# Patient Record
Sex: Female | Born: 1990 | Race: White | Hispanic: No | Marital: Married | Smoking: Never smoker
Health system: Southern US, Community
[De-identification: ages and names within clinical notes are randomized; demographics above are authoritative.]

## PROBLEM LIST (undated history)

## (undated) DIAGNOSIS — J45909 Unspecified asthma, uncomplicated: Secondary | ICD-10-CM

## (undated) DIAGNOSIS — O139 Gestational [pregnancy-induced] hypertension without significant proteinuria, unspecified trimester: Secondary | ICD-10-CM

## (undated) DIAGNOSIS — L309 Dermatitis, unspecified: Secondary | ICD-10-CM

## (undated) DIAGNOSIS — L509 Urticaria, unspecified: Secondary | ICD-10-CM

## (undated) DIAGNOSIS — E039 Hypothyroidism, unspecified: Secondary | ICD-10-CM

## (undated) DIAGNOSIS — I10 Essential (primary) hypertension: Secondary | ICD-10-CM

## (undated) HISTORY — DX: Hypothyroidism, unspecified: E03.9

## (undated) HISTORY — DX: Dermatitis, unspecified: L30.9

## (undated) HISTORY — PX: WISDOM TOOTH EXTRACTION: SHX21

## (undated) HISTORY — DX: Gestational (pregnancy-induced) hypertension without significant proteinuria, unspecified trimester: O13.9

## (undated) HISTORY — DX: Essential (primary) hypertension: I10

## (undated) HISTORY — PX: NO PAST SURGERIES: SHX2092

## (undated) HISTORY — DX: Urticaria, unspecified: L50.9

## (undated) HISTORY — DX: Unspecified asthma, uncomplicated: J45.909

---

## 2017-07-06 DIAGNOSIS — G43709 Chronic migraine without aura, not intractable, without status migrainosus: Secondary | ICD-10-CM | POA: Diagnosis not present

## 2017-08-04 ENCOUNTER — Telehealth: Payer: Self-pay | Admitting: Physician Assistant

## 2017-08-04 NOTE — Telephone Encounter (Signed)
Tried to call pt to reschedule her for appt with Carlis Abbott for tomorrow 08/05/17. Left VM asking her to call the office and let us know if she would like to try and reschedule with [facility]ark or if she would like to try another provider

## 2017-08-05 ENCOUNTER — Ambulatory Visit: Payer: Self-pay | Admitting: Physician Assistant

## 2017-08-08 ENCOUNTER — Other Ambulatory Visit: Payer: Self-pay

## 2017-08-08 ENCOUNTER — Encounter: Payer: Self-pay | Admitting: Physician Assistant

## 2017-08-08 ENCOUNTER — Ambulatory Visit (INDEPENDENT_AMBULATORY_CARE_PROVIDER_SITE_OTHER): Payer: 59 | Admitting: Physician Assistant

## 2017-08-08 VITALS — BP 145/94 | HR 86 | Temp 98.2°F | Resp 16 | Ht 66.0 in | Wt 236.0 lb

## 2017-08-08 DIAGNOSIS — L989 Disorder of the skin and subcutaneous tissue, unspecified: Secondary | ICD-10-CM

## 2017-08-08 DIAGNOSIS — E039 Hypothyroidism, unspecified: Secondary | ICD-10-CM

## 2017-08-08 DIAGNOSIS — I10 Essential (primary) hypertension: Secondary | ICD-10-CM

## 2017-08-08 MED ORDER — LEVOTHYROXINE SODIUM 75 MCG PO TABS: 75.0000 ug | ORAL_TABLET | Freq: Every day | ORAL | 1 refills | Status: DC

## 2017-08-08 MED ORDER — LISINOPRIL 20 MG PO TABS: 20.0000 mg | ORAL_TABLET | Freq: Every day | ORAL | 1 refills | Status: DC

## 2017-08-08 NOTE — Patient Instructions (Addendum)
It was great meeting you.   Please await contact for your dermatology appointment.    IF you received an x-ray today, you will receive an invoice from [facility]. Please contact [facility] at [phone] with questions or concerns regarding your invoice.   IF you received labwork today, you will receive an invoice from [place]. Please contact [facility] at [phone] with questions or concerns regarding your invoice.   Our billing staff will not be able to assist you with questions regarding bills from these companies.  You will be contacted with the lab results as soon as they are available. The fastest way to get your results is to activate your My Chart account. Instructions are located on the last page of this paperwork. If you have not heard from [place] regarding the results in 2 weeks, please contact this office.

## 2017-08-08 NOTE — Progress Notes (Signed)
PRIMARY CARE AT [facility] [address], [place] [address] [phone]  Date:  08/08/2017   Name:  Christy Rivera   DOB:  02-Jun-1991   MRN:  [id number]  PCP:  Patient, No Pcp Per    History of Present Illness:  Christy Rivera is a 27 y.o. female patient who presents to PCP with  Chief Complaint  Patient presents with  . Medication Refill    lisinopril and levothyroxine/ est care     She is not out of her synthroid or lisinopril.  She is not out of her meds.   Levothyroxine '75mg'$  daily.  She has a maternal aunt with hypothyroid She had dizziness and fatigue when this was diagnosed.  It has been managed well with the levothyroxine.  No side effects to the medicaiton.  This was found 8 years ago.   She had no imaging with this finding Husband relocating She is moving from [place], to [place].   She has been on lisinopril 4-5 years.  No side effects to this medication.     No chest pains, palpitiations, leg swelling, sob, consitpation or diarrhea, no change to skin or hair.  No change of your mood.   Diet: she watches salt intake, typically low carb.  Dinner meat and green veggies.   Exercise: long walks 2-3 times per week with 2 miles.  Takes about 40 minutes.    She is currently working as Government social research officer.  She works for [employer]   Menses is normal.  Regular No children.  No pregnancies.  Normal pap hx.   [facility].    There are no active problems to display for this patient.   Past Medical History:  Diagnosis Date  . Asthma     Social History   Tobacco Use  . Smoking status: Never Smoker  . Smokeless tobacco: Never Used  Substance Use Topics  . Alcohol use: Yes  . Drug use: No    No family history on file.  Not on File  Medication list has been reviewed and updated.  Current Outpatient Medications on File Prior to Visit  Medication Sig Dispense Refill  . levothyroxine (SYNTHROID, LEVOTHROID) 75 MCG tablet Take 75 mcg by mouth daily before  breakfast.    . lisinopril (PRINIVIL,ZESTRIL) 20 MG tablet Take 20 mg by mouth daily.     No current facility-administered medications on file prior to visit.     ROS ROS otherwise unremarkable unless listed above.  Physical Examination: BP (!) 145/94   Pulse 86   Temp 98.2 F (36.8 C) (Oral)   Resp 16   Ht '5\' 6"'$  (1.676 m)   Wt 236 lb (107 kg)   LMP 07/29/2017   SpO2 98%   BMI 38.09 kg/m  Ideal Body Weight: Weight in (lb) to have BMI = 25: 154.6  Physical Exam  Constitutional: She is oriented to person, place, and time. She appears well-developed and well-nourished. No distress.  HENT:  Head: Normocephalic and atraumatic.  Right Ear: External ear normal.  Left Ear: External ear normal.  Eyes: Conjunctivae and EOM are normal. Pupils are equal, round, and reactive to light.  Cardiovascular: Normal rate.  Pulmonary/Chest: Effort normal. No respiratory distress.  Neurological: She is alert and oriented to person, place, and time.  Skin: She is not diaphoretic.  Psychiatric: She has a normal mood and affect. Her behavior is normal.     Assessment and Plan: Christy Rivera is a 27 y.o. female who is here today  for cc of  Chief Complaint  Patient presents with  . Medication Refill    lisinopril and levothyroxine/ est care  bp stable Tsh pending  Skin lesion - Plan: Ambulatory referral to Dermatology  Hypothyroidism, unspecified type - Plan: TSH, CMP14+EGFR, levothyroxine (SYNTHROID, LEVOTHROID) 75 MCG tablet  Essential hypertension - Plan: lisinopril (PRINIVIL,ZESTRIL) 20 MG tablet  Ivar Drape, PA-C [facility] 2/26/20193:13 PM

## 2017-08-09 LAB — CMP14+EGFR
A/G RATIO: 1.7 (ref 1.2–2.2)
ALT: 30 IU/L (ref 0–32)
AST: 18 IU/L (ref 0–40)
Albumin: 4.9 g/dL (ref 3.5–5.5)
Alkaline Phosphatase: 57 IU/L (ref 39–117)
BILIRUBIN TOTAL: 0.3 mg/dL (ref 0.0–1.2)
BUN/Creatinine Ratio: 11 (ref 9–23)
BUN: 7 mg/dL (ref 6–20)
CALCIUM: 9.3 mg/dL (ref 8.7–10.2)
CO2: 21 mmol/L (ref 20–29)
Chloride: 101 mmol/L (ref 96–106)
Creatinine, Ser: 0.65 mg/dL (ref 0.57–1.00)
GFR, EST AFRICAN AMERICAN: 141 mL/min/{1.73_m2} (ref >59)
GFR, EST NON AFRICAN AMERICAN: 122 mL/min/{1.73_m2} (ref >59)
GLOBULIN, TOTAL: 2.9 g/dL (ref 1.5–4.5)
Glucose: 88 mg/dL (ref 65–99)
POTASSIUM: 3.9 mmol/L (ref 3.5–5.2)
SODIUM: 140 mmol/L (ref 134–144)
TOTAL PROTEIN: 7.8 g/dL (ref 6.0–8.5)

## 2017-08-09 LAB — TSH: TSH: 2.09 u[IU]/mL (ref 0.450–4.500)

## 2017-08-26 ENCOUNTER — Telehealth: Payer: Self-pay | Admitting: Physician Assistant

## 2017-08-26 NOTE — Telephone Encounter (Signed)
MyChart message sent to pt about English leaving the practice

## 2017-09-28 ENCOUNTER — Encounter: Payer: Self-pay | Admitting: Physician Assistant

## 2017-11-07 ENCOUNTER — Encounter: Payer: 59 | Admitting: Physician Assistant

## 2017-11-30 DIAGNOSIS — L910 Hypertrophic scar: Secondary | ICD-10-CM | POA: Diagnosis not present

## 2017-11-30 DIAGNOSIS — D225 Melanocytic nevi of trunk: Secondary | ICD-10-CM | POA: Diagnosis not present

## 2017-11-30 DIAGNOSIS — L814 Other melanin hyperpigmentation: Secondary | ICD-10-CM | POA: Diagnosis not present

## 2017-11-30 DIAGNOSIS — L905 Scar conditions and fibrosis of skin: Secondary | ICD-10-CM | POA: Diagnosis not present

## 2018-01-02 DIAGNOSIS — L905 Scar conditions and fibrosis of skin: Secondary | ICD-10-CM | POA: Diagnosis not present

## 2018-01-23 ENCOUNTER — Ambulatory Visit (INDEPENDENT_AMBULATORY_CARE_PROVIDER_SITE_OTHER): Payer: 59 | Admitting: Family Medicine

## 2018-01-23 ENCOUNTER — Encounter: Payer: Self-pay | Admitting: Family Medicine

## 2018-01-23 ENCOUNTER — Other Ambulatory Visit: Payer: Self-pay

## 2018-01-23 VITALS — BP 138/93 | HR 97 | Temp 99.0°F | Ht 66.0 in | Wt 231.8 lb

## 2018-01-23 DIAGNOSIS — Z01419 Encounter for gynecological examination (general) (routine) without abnormal findings: Secondary | ICD-10-CM

## 2018-01-23 DIAGNOSIS — Z30011 Encounter for initial prescription of contraceptive pills: Secondary | ICD-10-CM | POA: Diagnosis not present

## 2018-01-23 DIAGNOSIS — E039 Hypothyroidism, unspecified: Secondary | ICD-10-CM

## 2018-01-23 DIAGNOSIS — I10 Essential (primary) hypertension: Secondary | ICD-10-CM | POA: Diagnosis not present

## 2018-01-23 LAB — POCT URINE PREGNANCY: Preg Test, Ur: NEGATIVE

## 2018-01-23 MED ORDER — LISINOPRIL-HYDROCHLOROTHIAZIDE 20-25 MG PO TABS: 1.0000 | ORAL_TABLET | Freq: Every day | ORAL | 1 refills | Status: DC

## 2018-01-23 MED ORDER — LEVOTHYROXINE SODIUM 75 MCG PO TABS: 75.0000 ug | ORAL_TABLET | Freq: Every day | ORAL | 1 refills | Status: DC

## 2018-01-23 MED ORDER — LEVONORGESTREL-ETHINYL ESTRAD 0.1-20 MG-MCG PO TABS: 1.0000 | ORAL_TABLET | Freq: Every day | ORAL | 11 refills | Status: DC

## 2018-01-23 NOTE — Patient Instructions (Signed)
     IF you received an x-ray today, you will receive an invoice from [facility]. Please contact [facility] at [phone] with questions or concerns regarding your invoice.   IF you received labwork today, you will receive an invoice from [facility]. Please contact [facility] at [phone] with questions or concerns regarding your invoice.   Our billing staff will not be able to assist you with questions regarding bills from these companies.  You will be contacted with the lab results as soon as they are available. The fastest way to get your results is to activate your My Chart account. Instructions are located on the last page of this paperwork. If you have not heard from us regarding the results in 2 weeks, please contact this office.     

## 2018-01-23 NOTE — Progress Notes (Signed)
8/5/201911:29 AM  Christy Rivera 10-05-1990, 27 y.o. female [id number]  Chief Complaint  Patient presents with  . Annual Exam    GYN exam    HPI:   Patient is a 27 y.o. female with past medical history significant for hypothyroidism and HTN who presents today for CPE  Last CPE about 1.5 years Diagnosed with HTN at age 28, no workup other than basic labs No sx of sleep apnea other than snoring Her mother also dx with HTN in her 60s Last pap 3 years ago, normal, denies abnormal G0, LMP 01/01/2018. Normal regular menses Denies h/o STDs Would like to restart OCPs, done ok with them in the past States dc with hypothyroidism about 3 years ago, stable on current dose Takes all meds as prescribed, denies se   Fall Risk  01/23/2018 01/23/2018  Falls in the past year? No No     Depression screen [facility] 2/9 01/23/2018 01/23/2018  Decreased Interest 0 0  Down, Depressed, Hopeless 0 0  PHQ - 2 Score 0 0    No Known Allergies  Prior to Admission medications   Medication Sig Start Date End Date Taking? Authorizing Provider  levothyroxine (SYNTHROID, LEVOTHROID) 75 MCG tablet Take 1 tablet (75 mcg total) by mouth daily before breakfast. 08/08/17  Yes English, Stephanie D, PA  lisinopril (PRINIVIL,ZESTRIL) 20 MG tablet Take 1 tablet (20 mg total) by mouth daily. 08/08/17  Yes Joretta Bachelor, PA    Past Medical History:  Diagnosis Date  . Asthma     History reviewed. No pertinent surgical history.  Social History   Tobacco Use  . Smoking status: Never Smoker  . Smokeless tobacco: Never Used  Substance Use Topics  . Alcohol use: Yes    Family History  Problem Relation Age of Onset  . Heart disease Mother   . Hyperlipidemia Mother   . Hypertension Mother   . Healthy Brother   . Heart disease Paternal Uncle   . Hyperlipidemia Paternal Uncle   . Hypertension Paternal Uncle   . Cancer Maternal Grandfather   . Diabetes Paternal Grandmother     Review of Systems    Constitutional: Negative for chills and fever.  Eyes: Negative for blurred vision and double vision.  Respiratory: Negative for cough and shortness of breath.   Cardiovascular: Negative for chest pain, palpitations and leg swelling.  Gastrointestinal: Negative for abdominal pain, nausea and vomiting.  Genitourinary: Negative for dysuria and hematuria.  Musculoskeletal: Negative for joint pain and myalgias.  Neurological: Negative for dizziness and headaches.  Psychiatric/Behavioral: Negative for depression. The patient is not nervous/anxious and does not have insomnia.   All other systems reviewed and are negative.    OBJECTIVE:  Blood pressure (!) 138/93, pulse 97, temperature 99 F (37.2 C), temperature source Oral, height '5\' 6"'$  (1.676 m), weight 231 lb 12.8 oz (105.1 kg), last menstrual period 01/02/2018, SpO2 99 %. Body mass index is 37.41 kg/m.    Visual Acuity Screening   Right eye Left eye Both eyes  Without correction:     With correction: '20/20 20/20 20/20 '$    Physical Exam  Constitutional: She is oriented to person, place, and time. She appears well-developed and well-nourished.  HENT:  Head: Normocephalic and atraumatic.  Right Ear: Hearing, tympanic membrane, external ear and ear canal normal.  Left Ear: Hearing, tympanic membrane, external ear and ear canal normal.  Mouth/Throat: Oropharynx is clear and moist.  Eyes: Pupils are equal, round, and reactive to light. Conjunctivae and  EOM are normal.  Neck: Neck supple. No thyromegaly present.  Cardiovascular: Normal rate, regular rhythm, normal heart sounds and intact distal pulses. Exam reveals no gallop and no friction rub.  No murmur heard. Pulmonary/Chest: Effort normal and breath sounds normal. She has no wheezes. She has no rhonchi. She has no rales. Right breast exhibits no inverted nipple, no mass, no nipple discharge, no skin change and no tenderness. Left breast exhibits no inverted nipple, no mass, no nipple  discharge, no skin change and no tenderness. Breasts are symmetrical.  Abdominal: Soft. Bowel sounds are normal. She exhibits no distension. There is no hepatosplenomegaly. There is no tenderness. There is no guarding.  Genitourinary: There is no rash or lesion on the right labia. There is no rash or lesion on the left labia. Uterus is not enlarged, not fixed and not tender. Cervix exhibits no motion tenderness, no discharge and no friability. Right adnexum displays no mass and no tenderness. Left adnexum displays no mass and no tenderness. No erythema in the vagina. No vaginal discharge found.  Musculoskeletal: Normal range of motion. She exhibits no edema.  Lymphadenopathy:    She has no cervical adenopathy.    She has no axillary adenopathy.       Right: No supraclavicular adenopathy present.       Left: No supraclavicular adenopathy present.  Neurological: She is alert and oriented to person, place, and time. She has normal reflexes.  Skin: Skin is warm and dry.  Psychiatric: She has a normal mood and affect.  Nursing note and vitals reviewed.   Results for orders placed or performed in visit on 01/23/18 (from the past 24 hour(s))  POCT urine pregnancy     Status: Normal   Collection Time: 01/23/18 12:04 PM  Result Value Ref Range   Preg Test, Ur Negative Negative     ASSESSMENT and PLAN  1. Encounter for annual routine gynecological examination Routine HCM labs ordered. HCM reviewed/discussed. Anticipatory guidance regarding healthy weight, lifestyle and choices given.  - Pap IG w/ reflex to HPV when ASC-U  2. Hypothyroidism, unspecified type Checking labs today, medications will be adjusted as needed.  - TSH - Lipid panel - levothyroxine (SYNTHROID, LEVOTHROID) 75 MCG tablet; Take 1 tablet (75 mcg total) by mouth daily before breakfast.  3. Essential hypertension Above goal. Adding hctz '25mg'$  daily. Med r/se/b reviewed - CMP14+EGFR - Lipid panel  4. Encounter for initial  prescription of contraceptive pills Discussed options, patient had done ok with OCPs in the past. BP slightly above goal, we discussed CVD risk of OCPs and importance of BP control. Also discussed importance of BC in setting of ACE use. - POCT urine pregnancy   Other orders - lisinopril-hydrochlorothiazide (PRINZIDE,ZESTORETIC) 20-25 MG tablet; Take 1 tablet by mouth daily. - levonorgestrel-ethinyl estradiol (AVIANE,ALESSE,LESSINA) 0.1-20 MG-MCG tablet; Take 1 tablet by mouth daily.  Return in about 6 weeks (around 03/06/2018).    Rutherford Guys, MD Primary Care at [place], [place] [address] Ph.  [phone] Fax [phone]

## 2018-01-24 LAB — LIPID PANEL
Chol/HDL Ratio: 3.1 ratio (ref 0.0–4.4)
Cholesterol, Total: 220 mg/dL — ABNORMAL HIGH (ref 100–199)
HDL: 72 mg/dL (ref >39)
LDL Calculated: 127 mg/dL — ABNORMAL HIGH (ref 0–99)
Triglycerides: 107 mg/dL (ref 0–149)
VLDL Cholesterol Cal: 21 mg/dL (ref 5–40)

## 2018-01-24 LAB — CMP14+EGFR
ALT: 22 IU/L (ref 0–32)
AST: 13 IU/L (ref 0–40)
Albumin/Globulin Ratio: 1.9 (ref 1.2–2.2)
Albumin: 4.8 g/dL (ref 3.5–5.5)
Alkaline Phosphatase: 61 IU/L (ref 39–117)
BUN/Creatinine Ratio: 16 (ref 9–23)
BUN: 11 mg/dL (ref 6–20)
Bilirubin Total: 0.4 mg/dL (ref 0.0–1.2)
CO2: 18 mmol/L — ABNORMAL LOW (ref 20–29)
Calcium: 9.3 mg/dL (ref 8.7–10.2)
Chloride: 101 mmol/L (ref 96–106)
Creatinine, Ser: 0.7 mg/dL (ref 0.57–1.00)
GFR calc Af Amer: 137 mL/min/{1.73_m2} (ref >59)
GFR calc non Af Amer: 119 mL/min/{1.73_m2} (ref >59)
Globulin, Total: 2.5 g/dL (ref 1.5–4.5)
Glucose: 89 mg/dL (ref 65–99)
Potassium: 4.2 mmol/L (ref 3.5–5.2)
Sodium: 137 mmol/L (ref 134–144)
Total Protein: 7.3 g/dL (ref 6.0–8.5)

## 2018-01-24 LAB — TSH: TSH: 2.61 u[IU]/mL (ref 0.450–4.500)

## 2018-01-26 LAB — PAP IG W/ RFLX HPV ASCU: PAP Smear Comment: 0

## 2018-02-03 DIAGNOSIS — G43709 Chronic migraine without aura, not intractable, without status migrainosus: Secondary | ICD-10-CM | POA: Diagnosis not present

## 2018-03-10 ENCOUNTER — Encounter: Payer: Self-pay | Admitting: Family Medicine

## 2018-03-10 ENCOUNTER — Other Ambulatory Visit: Payer: Self-pay

## 2018-03-10 ENCOUNTER — Ambulatory Visit: Payer: 59 | Admitting: Family Medicine

## 2018-03-10 VITALS — BP 142/93 | HR 94 | Temp 98.3°F | Ht 66.0 in | Wt 235.2 lb

## 2018-03-10 DIAGNOSIS — Z23 Encounter for immunization: Secondary | ICD-10-CM | POA: Diagnosis not present

## 2018-03-10 DIAGNOSIS — I10 Essential (primary) hypertension: Secondary | ICD-10-CM | POA: Diagnosis not present

## 2018-03-10 MED ORDER — AMLODIPINE BESYLATE 5 MG PO TABS: 5.0000 mg | ORAL_TABLET | Freq: Every day | ORAL | 1 refills | Status: DC

## 2018-03-10 NOTE — Patient Instructions (Signed)
° ° ° °  If you have lab work done today you will be contacted with your lab results within the next 2 weeks.  If you have not heard from us then please contact us. The fastest way to get your results is to register for My Chart. ° ° °IF you received an x-ray today, you will receive an invoice from [facility]. Please contact [facility] at [phone] with questions or concerns regarding your invoice.  ° °IF you received labwork today, you will receive an invoice from [facility]. Please contact [facility] at [phone] with questions or concerns regarding your invoice.  ° °Our billing staff will not be able to assist you with questions regarding bills from these companies. ° °You will be contacted with the lab results as soon as they are available. The fastest way to get your results is to activate your My Chart account. Instructions are located on the last page of this paperwork. If you have not heard from us regarding the results in 2 weeks, please contact this office. °  ° ° ° °

## 2018-03-10 NOTE — Progress Notes (Signed)
9/20/20199:27 AM  Christy Rivera [birth year], 28 y.o. female [id number]  Chief Complaint  Patient presents with  . Hypertension    Follow up on bp. Needs refill on bp meds    HPI:   Patient is a 27 y.o. female with past medical history significant for HTN who presents today for followup   Taking new BP meds Added HCTZ 25mg  at last visit No changes to BP Does not check at home Denies any sx  Fall Risk  03/10/2018 01/23/2018 01/23/2018  Falls in the past year? No No No     Depression screen [facility] 2/9 03/10/2018 01/23/2018 01/23/2018  Decreased Interest 0 0 0  Down, Depressed, Hopeless 0 0 0  PHQ - 2 Score 0 0 0    No Known Allergies  Prior to Admission medications   Medication Sig Start Date End Date Taking? Authorizing Provider  levonorgestrel-ethinyl estradiol (AVIANE,ALESSE,LESSINA) 0.1-20 MG-MCG tablet Take 1 tablet by mouth daily. 01/23/18  Yes Rutherford Guys, MD  levothyroxine (SYNTHROID, LEVOTHROID) 75 MCG tablet Take 1 tablet (75 mcg total) by mouth daily before breakfast. 01/23/18  Yes Rutherford Guys, MD  lisinopril-hydrochlorothiazide (PRINZIDE,ZESTORETIC) 20-25 MG tablet Take 1 tablet by mouth daily. 01/23/18  Yes Rutherford Guys, MD    Past Medical History:  Diagnosis Date  . Asthma   . Essential hypertension, benign     History reviewed. No pertinent surgical history.  Social History   Tobacco Use  . Smoking status: Never Smoker  . Smokeless tobacco: Never Used  Substance Use Topics  . Alcohol use: Yes    Family History  Problem Relation Age of Onset  . Heart disease Mother   . Hyperlipidemia Mother   . Hypertension Mother   . Healthy Brother   . Heart disease Paternal Uncle   . Hyperlipidemia Paternal Uncle   . Hypertension Paternal Uncle   . Cancer Maternal Grandfather   . Diabetes Paternal Grandmother     ROS Per hpi  OBJECTIVE:  Blood pressure (!) 142/93, pulse 94, temperature 98.3 F (36.8 C), temperature source Oral, height 5\' 6"  (1.676  m), weight 235 lb 3.2 oz (106.7 kg), last menstrual period 03/03/2018, SpO2 96 %. Body mass index is 37.96 kg/m.   BP Readings from Last 3 Encounters:  03/10/18 (!) 142/93  01/23/18 (!) 138/93  08/08/17 (!) 145/94   Wt Readings from Last 3 Encounters:  03/10/18 235 lb 3.2 oz (106.7 kg)  01/23/18 231 lb 12.8 oz (105.1 kg)  08/08/17 236 lb (107 kg)   Physical Exam  Constitutional: She is oriented to person, place, and time. She appears well-developed and well-nourished.  HENT:  Head: Normocephalic and atraumatic.  Mouth/Throat: Mucous membranes are normal.  Eyes: Pupils are equal, round, and reactive to light. Conjunctivae and EOM are normal. No scleral icterus.  Neck: Neck supple.  Pulmonary/Chest: Effort normal.  Neurological: She is alert and oriented to person, place, and time.  Skin: Skin is warm and dry.  Psychiatric: She has a normal mood and affect.  Nursing note and vitals reviewed.   ASSESSMENT and PLAN 1. Hypertension, unspecified type Not responsive to treatment so far, present before OCPs, no OSA sx, CMP and TSH normal. R/o Renal artery stenosis. Adding amlodipine - US Renal Artery Stenosis; Future  2. Need for prophylactic vaccination and inoculation against influenza - Flu Vaccine QUAD 36+ mos IM  Other orders - amLODipine (NORVASC) 5 MG tablet; Take 1 tablet (5 mg total) by mouth daily.  Return in about 4 weeks (around 04/07/2018).    Rutherford Guys, MD Primary Care at [place], [place] [address] Ph.  [phone] Fax [phone]

## 2018-03-20 ENCOUNTER — Encounter: Payer: Self-pay | Admitting: Family Medicine

## 2018-04-05 ENCOUNTER — Ambulatory Visit
Admission: RE | Admit: 2018-04-05 | Discharge: 2018-04-05 | Disposition: A | Discharge status: Home / Self Care | Payer: 59 | Source: Ambulatory Visit | Attending: Family Medicine | Admitting: Family Medicine

## 2018-04-05 DIAGNOSIS — I10 Essential (primary) hypertension: Secondary | ICD-10-CM

## 2018-04-05 DIAGNOSIS — I150 Renovascular hypertension: Secondary | ICD-10-CM | POA: Diagnosis not present

## 2018-04-10 ENCOUNTER — Ambulatory Visit (INDEPENDENT_AMBULATORY_CARE_PROVIDER_SITE_OTHER): Payer: 59 | Admitting: Family Medicine

## 2018-04-10 ENCOUNTER — Encounter: Payer: Self-pay | Admitting: Family Medicine

## 2018-04-10 ENCOUNTER — Other Ambulatory Visit: Payer: Self-pay

## 2018-04-10 VITALS — BP 132/84 | HR 100 | Temp 98.6°F | Ht 66.0 in | Wt 238.0 lb

## 2018-04-10 DIAGNOSIS — I10 Essential (primary) hypertension: Secondary | ICD-10-CM

## 2018-04-10 DIAGNOSIS — E039 Hypothyroidism, unspecified: Secondary | ICD-10-CM

## 2018-04-10 MED ORDER — LEVOTHYROXINE SODIUM 75 MCG PO TABS: 75.0000 ug | ORAL_TABLET | Freq: Every day | ORAL | 1 refills | Status: DC

## 2018-04-10 MED ORDER — LEVONORGESTREL-ETHINYL ESTRAD 0.1-20 MG-MCG PO TABS: 1.0000 | ORAL_TABLET | Freq: Every day | ORAL | 11 refills | Status: DC

## 2018-04-10 MED ORDER — AMLODIPINE BESYLATE 5 MG PO TABS: 5.0000 mg | ORAL_TABLET | Freq: Every day | ORAL | 1 refills | Status: DC

## 2018-04-10 MED ORDER — LISINOPRIL-HYDROCHLOROTHIAZIDE 20-25 MG PO TABS: 1.0000 | ORAL_TABLET | Freq: Every day | ORAL | 1 refills | Status: DC

## 2018-04-10 NOTE — Progress Notes (Signed)
10/21/20198:37 AM  Christy Rivera [birth year], 27 y.o. female [id number]  Chief Complaint  Patient presents with  . Follow-up    follow up for [place] taken on last Wednesday of the kidney    HPI:   Patient is a 27 y.o. female with past medical history significant for HTN who presents today for followup after renal ultrasound  neg [place] for RAS but maybe fibromuscular dysplasia. if resistent then consider CTA abd w contrast  Fall Risk  04/10/2018 03/10/2018 01/23/2018 01/23/2018  Falls in the past year? No No No No     Depression screen [facility] 2/9 04/10/2018 03/10/2018 01/23/2018  Decreased Interest 0 0 0  Down, Depressed, Hopeless 0 0 0  PHQ - 2 Score 0 0 0    No Known Allergies  Prior to Admission medications   Medication Sig Start Date End Date Taking? Authorizing Provider  amLODipine (NORVASC) 5 MG tablet Take 1 tablet (5 mg total) by mouth daily. 03/10/18  Yes Rutherford Guys, MD  levonorgestrel-ethinyl estradiol (AVIANE,ALESSE,LESSINA) 0.1-20 MG-MCG tablet Take 1 tablet by mouth daily. 01/23/18  Yes Rutherford Guys, MD  levothyroxine (SYNTHROID, LEVOTHROID) 75 MCG tablet Take 1 tablet (75 mcg total) by mouth daily before breakfast. 01/23/18  Yes Rutherford Guys, MD  lisinopril-hydrochlorothiazide (PRINZIDE,ZESTORETIC) 20-25 MG tablet Take 1 tablet by mouth daily. 01/23/18  Yes Rutherford Guys, MD    Past Medical History:  Diagnosis Date  . Asthma   . Essential hypertension, benign     History reviewed. No pertinent surgical history.  Social History   Tobacco Use  . Smoking status: Never Smoker  . Smokeless tobacco: Never Used  Substance Use Topics  . Alcohol use: Yes    Family History  Problem Relation Age of Onset  . Heart disease Mother   . Hyperlipidemia Mother   . Hypertension Mother   . Healthy Brother   . Heart disease Paternal Uncle   . Hyperlipidemia Paternal Uncle   . Hypertension Paternal Uncle   . Cancer Maternal Grandfather   . Diabetes Paternal  Grandmother     ROS Per hpi  OBJECTIVE:  Blood pressure 138/81, pulse 100, temperature 98.6 F (37 C), temperature source Oral, height 5\' 6"  (1.676 m), weight 238 lb (108 kg), SpO2 97 %. Body mass index is 38.41 kg/m.   BP Readings from Last 3 Encounters:  04/10/18 138/81  03/10/18 (!) 142/93  01/23/18 (!) 138/93    Physical Exam  Constitutional: She is oriented to person, place, and time. She appears well-developed and well-nourished.  HENT:  Head: Normocephalic and atraumatic.  Mouth/Throat: Mucous membranes are normal.  Eyes: Pupils are equal, round, and reactive to light. Conjunctivae and EOM are normal. No scleral icterus.  Neck: Neck supple.  Pulmonary/Chest: Effort normal.  Neurological: She is alert and oriented to person, place, and time.  Skin: Skin is warm and dry.  Psychiatric: She has a normal mood and affect.  Nursing note and vitals reviewed.  Lab Results  Component Value Date   TSH 2.610 01/23/2018   Lab Results  Component Value Date   CREATININE 0.70 01/23/2018   Lab Results  Component Value Date   NA 137 01/23/2018   K 4.2 01/23/2018   CL 101 01/23/2018   CO2 18 (L) 01/23/2018    ASSESSMENT and PLAN  1. Hypertension, unspecified type At goal on current regime. Renal US not normal, however not sure if clinically significant. Referring to renal to further assess.  - Ambulatory referral  to Nephrology  2. Hypothyroidism, unspecified type Controlled. Continue current regime.  - levothyroxine (SYNTHROID, LEVOTHROID) 75 MCG tablet; Take 1 tablet (75 mcg total) by mouth daily before breakfast.  Other orders - amLODipine (NORVASC) 5 MG tablet; Take 1 tablet (5 mg total) by mouth daily. - lisinopril-hydrochlorothiazide (PRINZIDE,ZESTORETIC) 20-25 MG tablet; Take 1 tablet by mouth daily. - levonorgestrel-ethinyl estradiol (AVIANE,ALESSE,LESSINA) 0.1-20 MG-MCG tablet; Take 1 tablet by mouth daily.  Return in about 4 months (around 08/11/2018) for  after renal, BP.    Rutherford Guys, MD Primary Care at [place], [place] [address] Ph.  [phone] Fax [phone]

## 2018-04-10 NOTE — Patient Instructions (Signed)
° ° ° °  If you have lab work done today you will be contacted with your lab results within the next 2 weeks.  If you have not heard from us then please contact us. The fastest way to get your results is to register for My Chart. ° ° °IF you received an x-ray today, you will receive an invoice from [facility]. Please contact [facility] at [phone] with questions or concerns regarding your invoice.  ° °IF you received labwork today, you will receive an invoice from [facility]. Please contact [facility] at [phone] with questions or concerns regarding your invoice.  ° °Our billing staff will not be able to assist you with questions regarding bills from these companies. ° °You will be contacted with the lab results as soon as they are available. The fastest way to get your results is to activate your My Chart account. Instructions are located on the last page of this paperwork. If you have not heard from us regarding the results in 2 weeks, please contact this office. °  ° ° ° °

## 2018-04-18 ENCOUNTER — Encounter: Payer: Self-pay | Admitting: Family Medicine

## 2018-04-18 ENCOUNTER — Ambulatory Visit: Payer: 59 | Admitting: Family Medicine

## 2018-04-18 VITALS — BP 132/84 | HR 89 | Temp 98.8°F | Resp 17 | Ht 66.0 in | Wt 240.0 lb

## 2018-04-18 DIAGNOSIS — L732 Hidradenitis suppurativa: Secondary | ICD-10-CM | POA: Diagnosis not present

## 2018-04-18 MED ORDER — DOXYCYCLINE HYCLATE 100 MG PO TABS: 100.0000 mg | ORAL_TABLET | Freq: Two times a day (BID) | ORAL | 0 refills | Status: DC

## 2018-04-18 NOTE — Progress Notes (Signed)
Subjective:  By signing my name below, I, [name], attest that this documentation has been prepared under the direction and in the presence of Merri Ray, MD. Electronically Signed: [name], [place]. 04/18/2018 , 4:33 PM .  Patient was seen in Room 9 .   Patient ID: Christy Rivera, female    DOB: [birth year], 27 y.o.   MRN: [id number] Chief Complaint  Patient presents with  . knot in armpit   HPI Christy Rivera is a 27 y.o. female  Patient states she woke up 5 days ago and noticed a large bump in her left armpit with a small bump towards the middle of her armpit. She felt the bumps and described hard feeling. She mentions the area started as a pinpoint pimple, and became painful on Sunday morning (2 days ago). She applied hot compresses over the area with some relief. The bumps never came to head, though the areas have become smaller. She denies any drainage from the area. She denies any change in razors or deodorant.   She works as a Marine scientist in [employer]. She moved from [place], [place].   There are no active problems to display for this patient.  Past Medical History:  Diagnosis Date  . Asthma   . Essential hypertension, benign    No past surgical history on file. No Known Allergies Prior to Admission medications   Medication Sig Start Date End Date Taking? Authorizing Provider  amLODipine (NORVASC) 5 MG tablet Take 1 tablet (5 mg total) by mouth daily. 04/10/18   Rutherford Guys, MD  levonorgestrel-ethinyl estradiol (AVIANE,ALESSE,LESSINA) 0.1-20 MG-MCG tablet Take 1 tablet by mouth daily. 04/10/18   Rutherford Guys, MD  levothyroxine (SYNTHROID, LEVOTHROID) 75 MCG tablet Take 1 tablet (75 mcg total) by mouth daily before breakfast. 04/10/18   Rutherford Guys, MD  lisinopril-hydrochlorothiazide (PRINZIDE,ZESTORETIC) 20-25 MG tablet Take 1 tablet by mouth daily. 04/10/18   Rutherford Guys, MD   Social History   Socioeconomic History  . Marital status:  Married    Spouse name: Not on file  . Number of children: Not on file  . Years of education: Not on file  . Highest education level: Not on file  Occupational History  . Not on file  Social Needs  . Financial resource strain: Not on file  . Food insecurity:    Worry: Not on file    Inability: Not on file  . Transportation needs:    Medical: Not on file    Non-medical: Not on file  Tobacco Use  . Smoking status: Never Smoker  . Smokeless tobacco: Never Used  Substance and Sexual Activity  . Alcohol use: Yes  . Drug use: No  . Sexual activity: Yes  Lifestyle  . Physical activity:    Days per week: Not on file    Minutes per session: Not on file  . Stress: Not on file  Relationships  . Social connections:    Talks on phone: Not on file    Gets together: Not on file    Attends religious service: Not on file    Active member of club or organization: Not on file    Attends meetings of clubs or organizations: Not on file    Relationship status: Not on file  . Intimate partner violence:    Fear of current or ex partner: Not on file    Emotionally abused: Not on file    Physically abused: Not on file    Forced  sexual activity: Not on file  Other Topics Concern  . Not on file  Social History Narrative  . Not on file   Review of Systems  Constitutional: Negative for chills, fatigue, fever and unexpected weight change.  Respiratory: Negative for cough.   Gastrointestinal: Negative for constipation, diarrhea, nausea and vomiting.  Skin: Negative for rash and wound.       2 bumps/lesion - left axilla  Neurological: Negative for dizziness, weakness and headaches.       Objective:   Physical Exam  Constitutional: She is oriented to person, place, and time. She appears well-developed and well-nourished. No distress.  HENT:  Head: Normocephalic and atraumatic.  Eyes: Pupils are equal, round, and reactive to light. EOM are normal.  Neck: Neck supple.  Cardiovascular:  Normal rate.  Pulmonary/Chest: Effort normal. No respiratory distress.  Musculoskeletal: Normal range of motion.  Neurological: She is alert and oriented to person, place, and time.  Skin: Skin is warm and dry.  Left axilla: 2 separate areas within the left axilla, one at inferior medial aspect, erythematous indurated area approximately 0.5 x 1.5 cm without any central fluctuance; another lesion/area upper axilla towards the upper arm that is tender to palpation, also with some erythema and induration approximately 0.5 x 1.5 cm without any central fluctuance; no lymphadenopathy  Psychiatric: She has a normal mood and affect. Her behavior is normal.  Nursing note and vitals reviewed.   Vitals:   04/18/18 1559  BP: 132/84  Pulse: 89  Resp: 17  Temp: 98.8 F (37.1 C)  TempSrc: Oral  SpO2: 98%  Weight: 240 lb (108.9 kg)  Height: 5\' 6"  (1.676 m)       Assessment & Plan:    Christy Rivera is a 27 y.o. female Axillary hidradenitis suppurativa - Plan: doxycycline (VIBRA-TABS) 100 MG tablet  -And rhinitis versus folliculitis of left axilla, no drainable abscess appreciated at this time.  Continue symptomatic care with warm compresses, avoiding shaving, doxycycline prescribed if not continue to improve in the next day or 2.  RTC precautions discussed.  Meds ordered this encounter  Medications  . doxycycline (VIBRA-TABS) 100 MG tablet    Sig: Take 1 tablet (100 mg total) by mouth 2 (two) times daily.    Dispense:  14 tablet    Refill:  0   Patient Instructions   Continue warm compresses, but if not improving in next few days, can start doxycycline. Thanks for coming in today.   Folliculitis Folliculitis is inflammation of the hair follicles. Folliculitis most commonly occurs on the scalp, thighs, legs, back, and buttocks. However, it can occur anywhere on the body. What are the causes? This condition may be caused by:  A bacterial infection (common).  A fungal infection.  A  viral infection.  Coming into contact with certain chemicals, especially oils and tars.  Shaving or waxing.  Applying greasy ointments or creams to your skin often.  Long-lasting folliculitis and folliculitis that keeps coming back can be caused by bacteria that live in the nostrils. What increases the risk? This condition is more likely to develop in people with:  A weakened immune system.  Diabetes.  Obesity.  What are the signs or symptoms? Symptoms of this condition include:  Redness.  Soreness.  Swelling.  Itching.  Small white or yellow, pus-filled, itchy spots (pustules) that appear over a reddened area. If there is an infection that goes deep into the follicle, these may develop into a boil (furuncle).  A group  of closely packed boils (carbuncle). These tend to form in hairy, sweaty areas of the body.  How is this diagnosed? This condition is diagnosed with a skin exam. To find what is causing the condition, your health care provider may take a sample of one of the pustules or boils for testing. How is this treated? This condition may be treated by:  Applying warm compresses to the affected areas.  Taking an antibiotic medicine or applying an antibiotic medicine to the skin.  Applying or bathing with an antiseptic solution.  Taking an over-the-counter medicine to help with itching.  Having a procedure to drain any pustules or boils. This may be done if a pustule or boil contains a lot of pus or fluid.  Laser hair removal. This may be done to treat long-lasting folliculitis.  Follow these instructions at home:  If directed, apply heat to the affected area as often as told by your health care provider. Use the heat source that your health care provider recommends, such as a moist heat pack or a heating pad. ? Place a towel between your skin and the heat source. ? Leave the heat on for 20-30 minutes. ? Remove the heat if your skin turns bright red. This is  especially important if you are unable to feel pain, heat, or cold. You may have a greater risk of getting burned.  If you were prescribed an antibiotic medicine, use it as told by your health care provider. Do not stop using the antibiotic even if you start to feel better.  Take over-the-counter and prescription medicines only as told by your health care provider.  Do not shave irritated skin.  Keep all follow-up visits as told by your health care provider. This is important. Get help right away if:  You have more redness, swelling, or pain in the affected area.  Red streaks are spreading from the affected area.  You have a fever. This information is not intended to replace advice given to you by your health care provider. Make sure you discuss any questions you have with your health care provider. Document Released: 08/16/2001 Document Revised: 12/26/2015 Document Reviewed: 03/28/2015 Elsevier Interactive Patient Education  [employer].   If you have lab work done today you will be contacted with your lab results within the next 2 weeks.  If you have not heard from [place] then please contact us. The fastest way to get your results is to register for My Chart.   IF you received an x-ray today, you will receive an invoice from [facility]. Please contact [facility] at [phone] with questions or concerns regarding your invoice.   IF you received labwork today, you will receive an invoice from [place] [facility]. Please contact [facility] at [phone] with questions or concerns regarding your invoice.   Our billing staff will not be able to assist you with questions regarding bills from these companies.  You will be contacted with the lab results as soon as they are available. The fastest way to get your results is to activate your My Chart account. Instructions are located on the last page of this paperwork. If you have not heard from [place] regarding the results in 2 weeks,  please contact this office.       I personally performed the services described in this documentation, which was scribed in my presence. The recorded information has been reviewed and considered for accuracy and completeness, addended by me as needed, and agree with information above.  Signed,   Merri Ray, MD [facility].  04/20/18 10:22 PM

## 2018-04-18 NOTE — Patient Instructions (Addendum)
Continue warm compresses, but if not improving in next few days, can start doxycycline. Thanks for coming in today.   Folliculitis Folliculitis is inflammation of the hair follicles. Folliculitis most commonly occurs on the scalp, thighs, legs, back, and buttocks. However, it can occur anywhere on the body. What are the causes? This condition may be caused by:  A bacterial infection (common).  A fungal infection.  A viral infection.  Coming into contact with certain chemicals, especially oils and tars.  Shaving or waxing.  Applying greasy ointments or creams to your skin often.  Long-lasting folliculitis and folliculitis that keeps coming back can be caused by bacteria that live in the nostrils. What increases the risk? This condition is more likely to develop in people with:  A weakened immune system.  Diabetes.  Obesity.  What are the signs or symptoms? Symptoms of this condition include:  Redness.  Soreness.  Swelling.  Itching.  Small white or yellow, pus-filled, itchy spots (pustules) that appear over a reddened area. If there is an infection that goes deep into the follicle, these may develop into a boil (furuncle).  A group of closely packed boils (carbuncle). These tend to form in hairy, sweaty areas of the body.  How is this diagnosed? This condition is diagnosed with a skin exam. To find what is causing the condition, your health care provider may take a sample of one of the pustules or boils for testing. How is this treated? This condition may be treated by:  Applying warm compresses to the affected areas.  Taking an antibiotic medicine or applying an antibiotic medicine to the skin.  Applying or bathing with an antiseptic solution.  Taking an over-the-counter medicine to help with itching.  Having a procedure to drain any pustules or boils. This may be done if a pustule or boil contains a lot of pus or fluid.  Laser hair removal. This may be  done to treat long-lasting folliculitis.  Follow these instructions at home:  If directed, apply heat to the affected area as often as told by your health care provider. Use the heat source that your health care provider recommends, such as a moist heat pack or a heating pad. ? Place a towel between your skin and the heat source. ? Leave the heat on for 20-30 minutes. ? Remove the heat if your skin turns bright red. This is especially important if you are unable to feel pain, heat, or cold. You may have a greater risk of getting burned.  If you were prescribed an antibiotic medicine, use it as told by your health care provider. Do not stop using the antibiotic even if you start to feel better.  Take over-the-counter and prescription medicines only as told by your health care provider.  Do not shave irritated skin.  Keep all follow-up visits as told by your health care provider. This is important. Get help right away if:  You have more redness, swelling, or pain in the affected area.  Red streaks are spreading from the affected area.  You have a fever. This information is not intended to replace advice given to you by your health care provider. Make sure you discuss any questions you have with your health care provider. Document Released: 08/16/2001 Document Revised: 12/26/2015 Document Reviewed: 03/28/2015 Elsevier Interactive Patient Education  [employer].   If you have lab work done today you will be contacted with your lab results within the next 2 weeks.  If you have not  heard from [place] then please contact us. The fastest way to get your results is to register for My Chart.   IF you received an x-ray today, you will receive an invoice from [facility]. Please contact [facility] at [phone] with questions or concerns regarding your invoice.   IF you received labwork today, you will receive an invoice from [place]. Please contact [facility] at  [phone] with questions or concerns regarding your invoice.   Our billing staff will not be able to assist you with questions regarding bills from these companies.  You will be contacted with the lab results as soon as they are available. The fastest way to get your results is to activate your My Chart account. Instructions are located on the last page of this paperwork. If you have not heard from [place] regarding the results in 2 weeks, please contact this office.

## 2018-05-15 ENCOUNTER — Telehealth: Payer: Self-pay

## 2018-05-15 ENCOUNTER — Encounter: Payer: Self-pay | Admitting: Family Medicine

## 2018-05-15 NOTE — Telephone Encounter (Signed)
Message sent to Referrals to check on referral to Nephrology - sent 04/10/2018 Pt wants [place] to follow up.  See message - she has called [facility] and they could not tell her anything..... Thanks

## 2018-05-15 NOTE — Telephone Encounter (Signed)
Spoke to patient and adv that I paper faxed over to [facility] 11/25

## 2018-05-30 DIAGNOSIS — I10 Essential (primary) hypertension: Secondary | ICD-10-CM | POA: Diagnosis not present

## 2018-06-01 ENCOUNTER — Other Ambulatory Visit: Payer: Self-pay | Admitting: Nephrology

## 2018-06-01 DIAGNOSIS — I10 Essential (primary) hypertension: Secondary | ICD-10-CM

## 2018-06-05 ENCOUNTER — Encounter: Payer: Self-pay | Admitting: Family Medicine

## 2018-06-05 NOTE — Progress Notes (Signed)
Diagnostic studies: Renal Artery Duplex ; no significant evidence of renal artery stenosis bilaterally by duplex ultrasound. There is some elevation of proximal and mild velocities in the right renal artery. Although not diagnostic of a hemodynamically significate renal artery stenosis, some asymmetry is present compared to the left side. Component of a process such as fibromuscular dysplasia cannot be entirely excluded. If there remains clinically significant hypertension, consider further evaluation with CTA of the abdomen with contrast.,

## 2018-06-06 ENCOUNTER — Ambulatory Visit
Admission: RE | Admit: 2018-06-06 | Discharge: 2018-06-06 | Disposition: A | Discharge status: Home / Self Care | Payer: 59 | Source: Ambulatory Visit | Attending: Nephrology | Admitting: Nephrology

## 2018-06-06 DIAGNOSIS — K5790 Diverticulosis of intestine, part unspecified, without perforation or abscess without bleeding: Secondary | ICD-10-CM | POA: Diagnosis not present

## 2018-06-06 DIAGNOSIS — I10 Essential (primary) hypertension: Secondary | ICD-10-CM

## 2018-06-06 MED ORDER — IOPAMIDOL (ISOVUE-370) INJECTION 76%
80.0000 mL | Freq: Once | INTRAVENOUS | Status: AC | PRN
  Administered 2018-06-06: 80 mL via INTRAVENOUS

## 2018-07-06 ENCOUNTER — Ambulatory Visit: Payer: 59 | Admitting: Family Medicine

## 2018-07-06 ENCOUNTER — Encounter: Payer: Self-pay | Admitting: Family Medicine

## 2018-07-06 ENCOUNTER — Other Ambulatory Visit: Payer: Self-pay

## 2018-07-06 VITALS — BP 128/83 | HR 94 | Temp 98.6°F | Resp 14 | Ht 66.0 in | Wt 242.0 lb

## 2018-07-06 DIAGNOSIS — Z6839 Body mass index (BMI) 39.0-39.9, adult: Secondary | ICD-10-CM | POA: Diagnosis not present

## 2018-07-06 DIAGNOSIS — E039 Hypothyroidism, unspecified: Secondary | ICD-10-CM | POA: Diagnosis not present

## 2018-07-06 DIAGNOSIS — I10 Essential (primary) hypertension: Secondary | ICD-10-CM | POA: Diagnosis not present

## 2018-07-06 MED ORDER — AMLODIPINE BESYLATE 5 MG PO TABS: 5.0000 mg | ORAL_TABLET | Freq: Every day | ORAL | 1 refills | Status: DC

## 2018-07-06 MED ORDER — LEVOTHYROXINE SODIUM 75 MCG PO TABS: 75.0000 ug | ORAL_TABLET | Freq: Every day | ORAL | 1 refills | Status: DC

## 2018-07-06 NOTE — Patient Instructions (Signed)
° ° ° °  If you have lab work done today you will be contacted with your lab results within the next 2 weeks.  If you have not heard from us then please contact us. The fastest way to get your results is to register for My Chart. ° ° °IF you received an x-ray today, you will receive an invoice from [facility]. Please contact [facility] at [phone] with questions or concerns regarding your invoice.  ° °IF you received labwork today, you will receive an invoice from [facility]. Please contact [facility] at [phone] with questions or concerns regarding your invoice.  ° °Our billing staff will not be able to assist you with questions regarding bills from these companies. ° °You will be contacted with the lab results as soon as they are available. The fastest way to get your results is to activate your My Chart account. Instructions are located on the last page of this paperwork. If you have not heard from us regarding the results in 2 weeks, please contact this office. °  ° ° ° °

## 2018-07-06 NOTE — Progress Notes (Signed)
1/16/202011:10 AM  Christy Rivera Nov 18, 1990, 28 y.o. female [id number]  Chief Complaint  Patient presents with  . Hypertension    needing refills synthroid and norvasc    HPI:   Patient is a 28 y.o. female with past medical history significant for HTN and hypothyroidism who presents today for routine followup  Last OV referred to renal due to abnormal renal US Had CT scan done - unremarkable Low sodium so stopped HCTZ and restarted lisinopril '20mg'$  She is BC  Overall feeling well and has no acute concerns today  Fall Risk  07/06/2018 04/18/2018 04/10/2018 03/10/2018 01/23/2018  Falls in the past year? 0 No No No No     Depression screen [facility] 2/9 07/06/2018 04/18/2018 04/10/2018  Decreased Interest 0 0 0  Down, Depressed, Hopeless 0 0 0  PHQ - 2 Score 0 0 0    No Known Allergies  Prior to Admission medications   Medication Sig Start Date End Date Taking? Authorizing Provider  amLODipine (NORVASC) 5 MG tablet Take 1 tablet (5 mg total) by mouth daily. 04/10/18  Yes Rutherford Guys, MD  doxycycline (VIBRA-TABS) 100 MG tablet Take 1 tablet (100 mg total) by mouth 2 (two) times daily. 04/18/18  Yes Wendie Agreste, MD  levonorgestrel-ethinyl estradiol (AVIANE,ALESSE,LESSINA) 0.1-20 MG-MCG tablet Take 1 tablet by mouth daily. 04/10/18  Yes Rutherford Guys, MD  levothyroxine (SYNTHROID, LEVOTHROID) 75 MCG tablet Take 1 tablet (75 mcg total) by mouth daily before breakfast. 04/10/18  Yes Rutherford Guys, MD  lisinopril (PRINIVIL,ZESTRIL) 20 MG tablet  06/01/18  Yes [provider]    Past Medical History:  Diagnosis Date  . Asthma   . Essential hypertension, benign     History reviewed. No pertinent surgical history.  Social History   Tobacco Use  . Smoking status: Never Smoker  . Smokeless tobacco: Never Used  Substance Use Topics  . Alcohol use: Yes    Family History  Problem Relation Age of Onset  . Heart disease Mother   . Hyperlipidemia Mother     . Hypertension Mother   . Healthy Brother   . Heart disease Paternal Uncle   . Hyperlipidemia Paternal Uncle   . Hypertension Paternal Uncle   . Cancer Maternal Grandfather   . Diabetes Paternal Grandmother     Review of Systems  Constitutional: Negative for chills and fever.  Respiratory: Negative for cough and shortness of breath.   Cardiovascular: Negative for chest pain, palpitations and leg swelling.  Gastrointestinal: Negative for abdominal pain, nausea and vomiting.     OBJECTIVE:  Blood pressure 128/83, pulse 94, temperature 98.6 F (37 C), temperature source Oral, resp. rate 14, height '5\' 6"'$  (1.676 m), weight 242 lb (109.8 kg), SpO2 98 %. Body mass index is 39.06 kg/m.   Wt Readings from Last 3 Encounters:  07/06/18 242 lb (109.8 kg)  04/18/18 240 lb (108.9 kg)  04/10/18 238 lb (108 kg)     Physical Exam Vitals signs and nursing note reviewed.  Constitutional:      Appearance: She is well-developed.  HENT:     Head: Normocephalic and atraumatic.     Mouth/Throat:     Pharynx: No oropharyngeal exudate.  Eyes:     General: No scleral icterus.    Conjunctiva/sclera: Conjunctivae normal.     Pupils: Pupils are equal, round, and reactive to light.  Neck:     Musculoskeletal: Neck supple.  Cardiovascular:     Rate and Rhythm: Normal rate and  regular rhythm.     Heart sounds: Normal heart sounds. No murmur. No friction rub. No gallop.   Pulmonary:     Effort: Pulmonary effort is normal.     Breath sounds: Normal breath sounds. No wheezing or rales.  Skin:    General: Skin is warm and dry.  Neurological:     Mental Status: She is alert and oriented to person, place, and time.     ASSESSMENT and PLAN  1. Hypertension, unspecified type Controlled. Continue current regime.  - Lipid panel - CMP14+EGFR  2. Hypothyroidism, unspecified type Checking labs today, medications will be adjusted as needed.  - TSH - levothyroxine (SYNTHROID, LEVOTHROID) 75 MCG  tablet; Take 1 tablet (75 mcg total) by mouth daily before breakfast.  3. BMI 39.0-39.9,adult Discussed importance of healthy diet, regular exercise and healthy weight.   Other orders - lisinopril (PRINIVIL,ZESTRIL) 20 MG tablet - amLODipine (NORVASC) 5 MG tablet; Take 1 tablet (5 mg total) by mouth daily. Other orders - lisinopril (PRINIVIL,ZESTRIL) 20 MG tablet    Return in about 6 months (around 01/04/2019) for HTN and hypothyrodiism.    Rutherford Guys, MD [facility] [place], [place] [address] Ph.  [phone] Fax [phone]

## 2018-07-07 LAB — CMP14+EGFR
ALT: 24 IU/L (ref 0–32)
AST: 17 IU/L (ref 0–40)
Albumin/Globulin Ratio: 1.9 (ref 1.2–2.2)
Albumin: 4.7 g/dL (ref 3.5–5.5)
Alkaline Phosphatase: 46 IU/L (ref 39–117)
BUN/Creatinine Ratio: 11 (ref 9–23)
BUN: 7 mg/dL (ref 6–20)
Bilirubin Total: 0.3 mg/dL (ref 0.0–1.2)
CO2: 18 mmol/L — ABNORMAL LOW (ref 20–29)
Calcium: 9.2 mg/dL (ref 8.7–10.2)
Chloride: 103 mmol/L (ref 96–106)
Creatinine, Ser: 0.63 mg/dL (ref 0.57–1.00)
GFR calc Af Amer: 142 mL/min/{1.73_m2} (ref >59)
GFR calc non Af Amer: 123 mL/min/{1.73_m2} (ref >59)
Globulin, Total: 2.5 g/dL (ref 1.5–4.5)
Glucose: 84 mg/dL (ref 65–99)
Potassium: 4.3 mmol/L (ref 3.5–5.2)
Sodium: 139 mmol/L (ref 134–144)
Total Protein: 7.2 g/dL (ref 6.0–8.5)

## 2018-07-07 LAB — LIPID PANEL
Chol/HDL Ratio: 3.2 ratio (ref 0.0–4.4)
Cholesterol, Total: 164 mg/dL (ref 100–199)
HDL: 51 mg/dL (ref >39)
LDL Calculated: 91 mg/dL (ref 0–99)
Triglycerides: 109 mg/dL (ref 0–149)
VLDL Cholesterol Cal: 22 mg/dL (ref 5–40)

## 2018-07-07 LAB — TSH: TSH: 1.78 u[IU]/mL (ref 0.450–4.500)

## 2018-08-07 DIAGNOSIS — G43109 Migraine with aura, not intractable, without status migrainosus: Secondary | ICD-10-CM | POA: Diagnosis not present

## 2018-08-07 DIAGNOSIS — R51 Headache: Secondary | ICD-10-CM | POA: Diagnosis not present

## 2018-10-31 ENCOUNTER — Encounter: Payer: Self-pay | Admitting: Family Medicine

## 2018-10-31 DIAGNOSIS — Z20828 Contact with and (suspected) exposure to other viral communicable diseases: Secondary | ICD-10-CM | POA: Diagnosis not present

## 2018-10-31 NOTE — Telephone Encounter (Signed)
Contacted pt who is doing 'drive through' testing today.  Will c/b to clinic if she needs an appt. Reviewed RTW guidelines for COVID-like symptoms.

## 2018-11-16 DIAGNOSIS — E039 Hypothyroidism, unspecified: Secondary | ICD-10-CM | POA: Diagnosis not present

## 2018-11-16 DIAGNOSIS — E559 Vitamin D deficiency, unspecified: Secondary | ICD-10-CM | POA: Diagnosis not present

## 2018-11-16 DIAGNOSIS — I10 Essential (primary) hypertension: Secondary | ICD-10-CM | POA: Diagnosis not present

## 2018-12-29 ENCOUNTER — Telehealth: Payer: Self-pay | Admitting: Genetic Counselor

## 2018-12-29 ENCOUNTER — Other Ambulatory Visit: Payer: Self-pay

## 2018-12-29 ENCOUNTER — Ambulatory Visit: Payer: 59 | Admitting: Family Medicine

## 2018-12-29 ENCOUNTER — Encounter: Payer: Self-pay | Admitting: Family Medicine

## 2018-12-29 VITALS — BP 124/84 | HR 89 | Temp 99.0°F | Ht 66.0 in | Wt 201.0 lb

## 2018-12-29 DIAGNOSIS — I10 Essential (primary) hypertension: Secondary | ICD-10-CM

## 2018-12-29 DIAGNOSIS — J45990 Exercise induced bronchospasm: Secondary | ICD-10-CM

## 2018-12-29 DIAGNOSIS — E039 Hypothyroidism, unspecified: Secondary | ICD-10-CM

## 2018-12-29 DIAGNOSIS — J4520 Mild intermittent asthma, uncomplicated: Secondary | ICD-10-CM | POA: Insufficient documentation

## 2018-12-29 HISTORY — DX: Essential (primary) hypertension: I10

## 2018-12-29 MED ORDER — AMLODIPINE BESYLATE 2.5 MG PO TABS: 2.5000 mg | ORAL_TABLET | Freq: Every day | ORAL | 1 refills | Status: DC

## 2018-12-29 MED ORDER — LEVOTHYROXINE SODIUM 75 MCG PO TABS: 75.0000 ug | ORAL_TABLET | Freq: Every day | ORAL | 1 refills | Status: DC

## 2018-12-29 MED ORDER — ALBUTEROL SULFATE HFA 108 (90 BASE) MCG/ACT IN AERS: 2.0000 | INHALATION_SPRAY | Freq: Four times a day (QID) | RESPIRATORY_TRACT | 2 refills | Status: DC | PRN

## 2018-12-29 NOTE — Telephone Encounter (Signed)
Spoke with patient re genetics visit. Confirmed date/time/location. In person visit.

## 2018-12-29 NOTE — Progress Notes (Signed)
7/10/20204:04 PM  Christy Rivera Nov 14, 1990, 28 y.o., female [id number]  Chief Complaint  Patient presents with  . Hypertension    kidney doctor took her off the lisinopril  . Medication Refill    synthroid and norvasc    HPI:   Patient is a 28 y.o. female with past medical history significant for HTN and hypothyrodism who presents today for routine followup  Last OV Dec 2019 Saw renal in May, low BP, stopped lisinopril She has lost about 45 lbs with exercise and diet, Noom Had gyn exam in June 2020, planning for children in near future  Having some mild SOB and wheezing when she runs Has h/o asthma in childhood Denies sx any other time Denies CP, palpitations  Depression screen [facility] 2/9 07/06/2018 04/18/2018 04/10/2018  Decreased Interest 0 0 0  Down, Depressed, Hopeless 0 0 0  PHQ - 2 Score 0 0 0    Fall Risk  07/06/2018 04/18/2018 04/10/2018 03/10/2018 01/23/2018  Falls in the past year? 0 No No No No     No Known Allergies  Prior to Admission medications   Medication Sig Start Date End Date Taking? Authorizing Provider  amLODipine (NORVASC) 5 MG tablet Take 1 tablet (5 mg total) by mouth daily. 07/06/18  Yes Rutherford Guys, MD  levothyroxine (SYNTHROID, LEVOTHROID) 75 MCG tablet Take 1 tablet (75 mcg total) by mouth daily before breakfast. 07/06/18  Yes Rutherford Guys, MD    Past Medical History:  Diagnosis Date  . Asthma   . Essential hypertension, benign     No past surgical history on file.  Social History   Tobacco Use  . Smoking status: Never Smoker  . Smokeless tobacco: Never Used  Substance Use Topics  . Alcohol use: Yes    Family History  Problem Relation Age of Onset  . Heart disease Mother   . Hyperlipidemia Mother   . Hypertension Mother   . Healthy Brother   . Heart disease Paternal Uncle   . Hyperlipidemia Paternal Uncle   . Hypertension Paternal Uncle   . Cancer Maternal Grandfather   . Diabetes Paternal Grandmother     ROS  Per hpi  OBJECTIVE:  Today's Vitals   12/29/18 1537  BP: 124/84  Pulse: 89  Temp: 99 F (37.2 C)  TempSrc: Oral  Weight: 201 lb (91.2 kg)  Height: '5\' 6"'$  (1.676 m)   Body mass index is 32.44 kg/m.  Wt Readings from Last 3 Encounters:  12/29/18 201 lb (91.2 kg)  07/06/18 242 lb (109.8 kg)  04/18/18 240 lb (108.9 kg)    Physical Exam Vitals signs and nursing note reviewed.  Constitutional:      Appearance: She is well-developed.  HENT:     Head: Normocephalic and atraumatic.     Mouth/Throat:     Pharynx: No oropharyngeal exudate.  Eyes:     General: No scleral icterus.    Conjunctiva/sclera: Conjunctivae normal.     Pupils: Pupils are equal, round, and reactive to light.  Neck:     Musculoskeletal: Neck supple.  Cardiovascular:     Rate and Rhythm: Normal rate and regular rhythm.     Heart sounds: Normal heart sounds. No murmur. No friction rub. No gallop.   Pulmonary:     Effort: Pulmonary effort is normal.     Breath sounds: Normal breath sounds. No wheezing or rales.  Skin:    General: Skin is warm and dry.  Neurological:     Mental Status:  She is alert and oriented to person, place, and time.      ASSESSMENT and PLAN  1. Essential hypertension Controlled. Patient with sign LFM. Trial of decreasing amlodipine with goal off meds. Home BP monitoring. Goal BP < 140/90. - Lipid panel - CMP14+EGFR  2. Hypothyroidism, unspecified type Checking labs today, medications will be adjusted as needed.  - TSH - levothyroxine (SYNTHROID) 75 MCG tablet; Take 1 tablet (75 mcg total) by mouth daily before breakfast.  3. Exercise-induced asthma Albuterol ex given  Other orders - amLODipine (NORVASC) 2.5 MG tablet; Take 1 tablet (2.5 mg total) by mouth daily. - albuterol (VENTOLIN HFA) 108 (90 Base) MCG/ACT inhaler; Inhale 2 puffs into the lungs every 6 (six) hours as needed for wheezing or shortness of breath.  Return in about 6 months (around 07/01/2019).     Rutherford Guys, MD [facility] [place], [place] [address] Ph.  [phone] Fax [phone]

## 2018-12-30 LAB — LIPID PANEL
Chol/HDL Ratio: 3.4 ratio (ref 0.0–4.4)
Cholesterol, Total: 198 mg/dL (ref 100–199)
HDL: 59 mg/dL (ref >39)
LDL Calculated: 105 mg/dL — ABNORMAL HIGH (ref 0–99)
Triglycerides: 169 mg/dL — ABNORMAL HIGH (ref 0–149)
VLDL Cholesterol Cal: 34 mg/dL (ref 5–40)

## 2018-12-30 LAB — CMP14+EGFR
ALT: 12 IU/L (ref 0–32)
AST: 11 IU/L (ref 0–40)
Albumin/Globulin Ratio: 2.2 (ref 1.2–2.2)
Albumin: 4.8 g/dL (ref 3.9–5.0)
Alkaline Phosphatase: 60 IU/L (ref 39–117)
BUN/Creatinine Ratio: 14 (ref 9–23)
BUN: 11 mg/dL (ref 6–20)
Bilirubin Total: 0.4 mg/dL (ref 0.0–1.2)
CO2: 19 mmol/L — ABNORMAL LOW (ref 20–29)
Calcium: 9.2 mg/dL (ref 8.7–10.2)
Chloride: 106 mmol/L (ref 96–106)
Creatinine, Ser: 0.76 mg/dL (ref 0.57–1.00)
GFR calc Af Amer: 123 mL/min/{1.73_m2} (ref >59)
GFR calc non Af Amer: 107 mL/min/{1.73_m2} (ref >59)
Globulin, Total: 2.2 g/dL (ref 1.5–4.5)
Glucose: 86 mg/dL (ref 65–99)
Potassium: 4 mmol/L (ref 3.5–5.2)
Sodium: 141 mmol/L (ref 134–144)
Total Protein: 7 g/dL (ref 6.0–8.5)

## 2018-12-30 LAB — TSH: TSH: 1.37 u[IU]/mL (ref 0.450–4.500)

## 2019-01-01 ENCOUNTER — Telehealth: Payer: Self-pay | Admitting: Genetic Counselor

## 2019-01-01 NOTE — Telephone Encounter (Signed)
Clarified with Christy Rivera that her genetics referral is for preconception carrier screening of a known familial variant and other genetic conditions, not for cancer genetics. We will refer to prenatal genetic counseling.

## 2019-01-05 ENCOUNTER — Ambulatory Visit: Payer: 59 | Admitting: Family Medicine

## 2019-01-11 ENCOUNTER — Encounter: Payer: 59 | Admitting: Genetic Counselor

## 2019-01-11 ENCOUNTER — Other Ambulatory Visit: Payer: 59

## 2019-03-05 ENCOUNTER — Ambulatory Visit: Payer: 59 | Admitting: Allergy and Immunology

## 2019-03-05 ENCOUNTER — Encounter: Payer: Self-pay | Admitting: Allergy and Immunology

## 2019-03-05 ENCOUNTER — Other Ambulatory Visit: Payer: Self-pay

## 2019-03-05 VITALS — BP 158/96 | HR 82 | Temp 97.8°F | Resp 16 | Ht 65.5 in | Wt 189.0 lb

## 2019-03-05 DIAGNOSIS — R030 Elevated blood-pressure reading, without diagnosis of hypertension: Secondary | ICD-10-CM

## 2019-03-05 DIAGNOSIS — L500 Allergic urticaria: Secondary | ICD-10-CM

## 2019-03-05 DIAGNOSIS — T7840XA Allergy, unspecified, initial encounter: Secondary | ICD-10-CM | POA: Diagnosis not present

## 2019-03-05 DIAGNOSIS — J4520 Mild intermittent asthma, uncomplicated: Secondary | ICD-10-CM

## 2019-03-05 DIAGNOSIS — J310 Chronic rhinitis: Secondary | ICD-10-CM | POA: Insufficient documentation

## 2019-03-05 HISTORY — DX: Allergy, unspecified, initial encounter: T78.40XA

## 2019-03-05 MED ORDER — AZELASTINE HCL 0.1 % NA SOLN: 1.0000 | Freq: Two times a day (BID) | NASAL | 5 refills | Status: DC

## 2019-03-05 MED ORDER — EPINEPHRINE 0.3 MG/0.3ML IJ SOAJ: 0.3000 mg | INTRAMUSCULAR | 1 refills | Status: DC | PRN

## 2019-03-05 MED ORDER — CARBINOXAMINE MALEATE 6 MG PO TABS: 1.0000 | ORAL_TABLET | ORAL | 5 refills | Status: DC

## 2019-03-05 NOTE — Progress Notes (Signed)
New Patient Note  RE: Christy Rivera MRN: [id number] DOB: 1990-10-21 Date of Office Visit: 03/05/2019  Referring provider: No ref. provider found Primary care provider: Rutherford Guys, MD  Chief Complaint: Allergic Reaction, Urticaria, and Allergic Rhinitis   History of present illness: Christy Rivera is a 28 y.o. female presenting today for evaluation of possible food allergy.  She reports that approximately 3 weeks ago she consumed a spicy tuna sushi bowl with vegetables, sesame, and soy at a restaurant for lunch.  Approximately 30 to 40 minutes later she developed a nonpruritic, erythematous "splotchy-esque, head to toe" rash and tachycardia.  She reports that her resting heart rate was between 130-140 bpm.  She took diphenhydramine and these symptoms resolved over the course of the next few hours.  However, the next morning she woke up with small erythematous, raised, pruritic hives as well as periorbital erythema and puffy eyelids.  She took diphenhydramine and the symptoms resolved over the next several hours.  She did not experience lightheadedness, nausea, vomiting, or diarrhea. Christy Rivera experiences nasal congestion, rhinorrhea, sneezing, postnasal drainage, nasal pruritus, and ocular pruritus.  These symptoms occur year-round but are most frequent and severe during the fall.  She takes levocetirizine "pretty regularly" in an attempt to control these symptoms. The patient has had symptoms consistent with asthma since she was 28 years old.  Her lower respiratory symptoms are triggered by viral respiratory tract infections, running outdoors, cold weather, and fresh mowing grass.  She has not required albuterol rescue over the past 6 months.  Assessment and plan: Allergic reaction The patients history suggests allergic reaction with an unclear trigger.  It is possible that this may represent scombroid fish poisoning.  Food allergen skin tests were negative today despite a positive histamine  control. The negative predictive value for skin tests is excellent (greater than 95%). We will proceed with in vitro lab studies to help establish an etiology.  The following labs have been ordered: FCeRI antibody, anti-thyroglobulin antibody, thyroid peroxidase antibody, baseline serum tryptase, CBC, CMP, ESR, ANA, and serum specific IgE against fish panel, soy, sesame, and alpha gal panel.  Negative labs would support scombroid fish poisoning.  Should symptoms recur, a journal is to be kept recording any foods eaten, beverages consumed, medications taken within a 6 hour period prior to the onset of symptoms, as well as activities performed, and environmental conditions. For any symptoms concerning for anaphylaxis, epinephrine is to be administered and 911 is to be called immediately.  A prescription has been provided for epinephrine autoinjector 2 pack along with instructions for its proper administration.  Chronic rhinitis All seasonal and perennial aeroallergen skin tests are negative despite a positive histamine control.  Intranasal steroids, intranasal antihistamines, and first generation antihistamines are effective for symptoms associated with non-allergic rhinitis, whereas second generation antihistamines such as cetirizine (Zyrtec), loratadine (Claritin) and fexofenadine (Allegra) have been found to be ineffective for this condition.  A prescription has been provided for azelastine nasal spray, one spray per nostril 1-2 times daily as needed. Proper nasal spray technique has been discussed and demonstrated.  Nasal saline spray (i.e., Simply Saline) or nasal saline lavage (i.e., NeilMed) is recommended as needed and prior to medicated nasal sprays.    A prescription has been provided for RyVent (carbinoxamine maleate) 34m every 6-8 hours as needed.  Mild intermittent asthma/exercise-induced bronchospasm Continue albuterol HFA, 1 to 2 inhalations every 4-6 hours if needed and 15 minutes  prior to vigorous exercise. Subjective and objective measures  of pulmonary function will be followed and the treatment plan will be adjusted accordingly.  Elevated blood-pressure reading without diagnosis of hypertension  The patient has been made aware of the elevated blood pressure readings and has been encouraged to follow up with her primary care physician in the near future regarding this issue.  Christy Rivera has verbalized understanding and agreed to do so.   Meds ordered this encounter  Medications  . EPINEPHrine (AUVI-Q) 0.3 mg/0.3 mL IJ SOAJ injection    Sig: Inject 0.3 mLs (0.3 mg total) into the muscle as needed for anaphylaxis.    Dispense:  2 each    Refill:  1  . azelastine (ASTELIN) 0.1 % nasal spray    Sig: Place 1 spray into both nostrils 2 (two) times daily.    Dispense:  30 mL    Refill:  5  . Carbinoxamine Maleate (RYVENT) 6 MG TABS    Sig: Take 1 tablet by mouth See admin instructions. Every 6-8 hours as needed.    Dispense:  120 tablet    Refill:  5    Diagnostics: Spirometry: Normal with an FEV1 of 98% predicted. This study was performed while the patient was asymptomatic.  Please see scanned spirometry results for details. Environmental skin testing: Negative despite a positive histamine control. Food allergen skin testing: Negative despite a positive histamine control.    Physical examination: Blood pressure (!) 158/96, pulse 82, temperature 97.8 F (36.6 C), temperature source Temporal, resp. rate 16, height 5' 5.5" (1.664 m), weight 189 lb (85.7 kg), SpO2 99 %.  General: Alert, interactive, in no acute distress. HEENT: TMs pearly gray, turbinates moderately edematous without discharge, post-pharynx mildly erythematous. Neck: Supple without lymphadenopathy. Lungs: Clear to auscultation without wheezing, rhonchi or rales. CV: Normal S1, S2 without murmurs. Abdomen: Nondistended, nontender. Skin: Warm and dry, without lesions or rashes. Extremities:  No  clubbing, cyanosis or edema. Neuro:   Grossly intact.  Review of systems:  Review of systems negative except as noted in HPI / PMHx or noted below: Review of Systems  Constitutional: Negative.   HENT: Negative.   Eyes: Negative.   Respiratory: Negative.   Cardiovascular: Negative.   Gastrointestinal: Negative.   Genitourinary: Negative.   Musculoskeletal: Negative.   Skin: Negative.   Neurological: Negative.   Endo/Heme/Allergies: Negative.   Psychiatric/Behavioral: Negative.     Past medical history:  Past Medical History:  Diagnosis Date  . Asthma   . Eczema   . Essential hypertension, benign   . Urticaria     Past surgical history:  Past Surgical History:  Procedure Laterality Date  . NO PAST SURGERIES      Family history: Family History  Problem Relation Age of Onset  . Heart disease Mother   . Hyperlipidemia Mother   . Hypertension Mother   . Allergic rhinitis Mother   . Asthma Mother   . Healthy Brother   . Heart disease Paternal Uncle   . Hyperlipidemia Paternal Uncle   . Hypertension Paternal Uncle   . Cancer Maternal Grandfather   . Diabetes Paternal Grandmother     Social history: Social History   Socioeconomic History  . Marital status: Married    Spouse name: Not on file  . Number of children: Not on file  . Years of education: Not on file  . Highest education level: Not on file  Occupational History  . Not on file  Social Needs  . Financial resource strain: Not on file  . Food insecurity  Worry: Not on file    Inability: Not on file  . Transportation needs    Medical: Not on file    Non-medical: Not on file  Tobacco Use  . Smoking status: Never Smoker  . Smokeless tobacco: Never Used  Substance and Sexual Activity  . Alcohol use: Yes  . Drug use: No  . Sexual activity: Yes  Lifestyle  . Physical activity    Days per week: Not on file    Minutes per session: Not on file  . Stress: Not on file  Relationships  . Social  Herbalist on phone: Not on file    Gets together: Not on file    Attends religious service: Not on file    Active member of club or organization: Not on file    Attends meetings of clubs or organizations: Not on file    Relationship status: Not on file  . Intimate partner violence    Fear of current or ex partner: Not on file    Emotionally abused: Not on file    Physically abused: Not on file    Forced sexual activity: Not on file  Other Topics Concern  . Not on file  Social History Narrative  . Not on file   Environmental History: The patient lives in an 28 year old house with hardwood floors throughout, gas heat, and central air.  There is no known mold/water damage in the home.  There is a dog in the home which has access to her bedroom.  She is a non-smoker.  Allergies as of 03/05/2019   No Known Allergies     Medication List       Accurate as of March 05, 2019  5:02 PM. If you have any questions, ask your nurse or doctor.        STOP taking these medications   amLODipine 2.5 MG tablet Commonly known as: NORVASC Stopped by: Edmonia Lynch, MD     TAKE these medications   albuterol 108 (90 Base) MCG/ACT inhaler Commonly known as: VENTOLIN HFA Inhale 2 puffs into the lungs every 6 (six) hours as needed for wheezing or shortness of breath.   azelastine 0.1 % nasal spray Commonly known as: ASTELIN Place 1 spray into both nostrils 2 (two) times daily. Started by: Edmonia Lynch, MD   Carbinoxamine Maleate 6 MG Tabs Commonly known as: RyVent Take 1 tablet by mouth See admin instructions. Every 6-8 hours as needed. Started by: Edmonia Lynch, MD   EPINEPHrine 0.3 mg/0.3 mL Soaj injection Commonly known as: Auvi-Q Inject 0.3 mLs (0.3 mg total) into the muscle as needed for anaphylaxis. Started by: Edmonia Lynch, MD   fluticasone 50 MCG/ACT nasal spray Commonly known as: FLONASE Place 1 spray into both nostrils daily as needed for allergies  or rhinitis.   levocetirizine 5 MG tablet Commonly known as: XYZAL Take 5 mg by mouth every evening.   levothyroxine 75 MCG tablet Commonly known as: SYNTHROID Take 1 tablet (75 mcg total) by mouth daily before breakfast.       Known medication allergies: No Known Allergies  I appreciate the opportunity to take part in Christy Rivera's care. Please do not hesitate to contact me with questions.  Sincerely,   R. Edgar Frisk, MD

## 2019-03-05 NOTE — Assessment & Plan Note (Signed)
All seasonal and perennial aeroallergen skin tests are negative despite a positive histamine control.  Intranasal steroids, intranasal antihistamines, and first generation antihistamines are effective for symptoms associated with non-allergic rhinitis, whereas second generation antihistamines such as cetirizine (Zyrtec), loratadine (Claritin) and fexofenadine (Allegra) have been found to be ineffective for this condition.  A prescription has been provided for azelastine nasal spray, one spray per nostril 1-2 times daily as needed. Proper nasal spray technique has been discussed and demonstrated.  Nasal saline spray (i.e., Simply Saline) or nasal saline lavage (i.e., NeilMed) is recommended as needed and prior to medicated nasal sprays.  A prescription has been provided for RyVent (carbinoxamine maleate) 6mg every 6-8 hours as needed. 

## 2019-03-05 NOTE — Assessment & Plan Note (Signed)
Continue albuterol HFA, 1 to 2 inhalations every 4-6 hours if needed and 15 minutes prior to vigorous exercise. Subjective and objective measures of pulmonary function will be followed and the treatment plan will be adjusted accordingly.

## 2019-03-05 NOTE — Assessment & Plan Note (Signed)
   The patient has been made aware of the elevated blood pressure readings and has been encouraged to follow up with her primary care physician in the near future regarding this issue.  Christy Rivera has verbalized understanding and agreed to do so.

## 2019-03-05 NOTE — Patient Instructions (Addendum)
Allergic reaction The patients history suggests allergic reaction with an unclear trigger.  It is possible that this may represent scombroid fish poisoning.  Food allergen skin tests were negative today despite a positive histamine control. The negative predictive value for skin tests is excellent (greater than 95%). We will proceed with in vitro lab studies to help establish an etiology.  The following labs have been ordered: FCeRI antibody, anti-thyroglobulin antibody, thyroid peroxidase antibody, baseline serum tryptase, CBC, CMP, ESR, ANA, and serum specific IgE against fish panel, soy, sesame, and alpha gal panel.  Negative labs would support scombroid fish poisoning.  Should symptoms recur, a journal is to be kept recording any foods eaten, beverages consumed, medications taken within a 6 hour period prior to the onset of symptoms, as well as activities performed, and environmental conditions. For any symptoms concerning for anaphylaxis, epinephrine is to be administered and 911 is to be called immediately.  A prescription has been provided for epinephrine autoinjector 2 pack along with instructions for its proper administration.  Chronic rhinitis All seasonal and perennial aeroallergen skin tests are negative despite a positive histamine control.  Intranasal steroids, intranasal antihistamines, and first generation antihistamines are effective for symptoms associated with non-allergic rhinitis, whereas second generation antihistamines such as cetirizine (Zyrtec), loratadine (Claritin) and fexofenadine (Allegra) have been found to be ineffective for this condition.  A prescription has been provided for azelastine nasal spray, one spray per nostril 1-2 times daily as needed. Proper nasal spray technique has been discussed and demonstrated.  Nasal saline spray (i.e., Simply Saline) or nasal saline lavage (i.e., NeilMed) is recommended as needed and prior to medicated nasal sprays.    A  prescription has been provided for RyVent (carbinoxamine maleate) '6mg'$  every 6-8 hours as needed.  Mild intermittent asthma/exercise-induced bronchospasm Continue albuterol HFA, 1 to 2 inhalations every 4-6 hours if needed and 15 minutes prior to vigorous exercise. Subjective and objective measures of pulmonary function will be followed and the treatment plan will be adjusted accordingly.  Elevated blood-pressure reading without diagnosis of hypertension  The patient has been made aware of the elevated blood pressure readings and has been encouraged to follow up with her primary care physician in the near future regarding this issue.  Christy Rivera has verbalized understanding and agreed to do so.   When lab results have returned the patient will be called with further recommendations.

## 2019-03-05 NOTE — Assessment & Plan Note (Addendum)
The patients history suggests allergic reaction with an unclear trigger.  It is possible that this may represent scombroid fish poisoning.  Food allergen skin tests were negative today despite a positive histamine control. The negative predictive value for skin tests is excellent (greater than 95%). We will proceed with in vitro lab studies to help establish an etiology.  The following labs have been ordered: FCeRI antibody, anti-thyroglobulin antibody, thyroid peroxidase antibody, baseline serum tryptase, CBC, CMP, ESR, ANA, and serum specific IgE against fish panel, soy, sesame, and alpha gal panel.  Negative labs would support scombroid fish poisoning.  Should symptoms recur, a journal is to be kept recording any foods eaten, beverages consumed, medications taken within a 6 hour period prior to the onset of symptoms, as well as activities performed, and environmental conditions. For any symptoms concerning for anaphylaxis, epinephrine is to be administered and 911 is to be called immediately.  A prescription has been provided for epinephrine autoinjector 2 pack along with instructions for its proper administration.

## 2019-03-09 LAB — ALLERGEN SESAME F10: Sesame Seed IgE: <0.1 kU/L

## 2019-03-09 LAB — COMPREHENSIVE METABOLIC PANEL
ALT: 11 IU/L (ref 0–32)
AST: 12 IU/L (ref 0–40)
Albumin/Globulin Ratio: 2.1 (ref 1.2–2.2)
Albumin: 5 g/dL (ref 3.9–5.0)
Alkaline Phosphatase: 64 IU/L (ref 39–117)
BUN/Creatinine Ratio: 9 (ref 9–23)
BUN: 6 mg/dL (ref 6–20)
Bilirubin Total: 0.6 mg/dL (ref 0.0–1.2)
CO2: 25 mmol/L (ref 20–29)
Calcium: 9.9 mg/dL (ref 8.7–10.2)
Chloride: 101 mmol/L (ref 96–106)
Creatinine, Ser: 0.7 mg/dL (ref 0.57–1.00)
GFR calc Af Amer: 136 mL/min/{1.73_m2} (ref >59)
GFR calc non Af Amer: 118 mL/min/{1.73_m2} (ref >59)
Globulin, Total: 2.4 g/dL (ref 1.5–4.5)
Glucose: 101 mg/dL — ABNORMAL HIGH (ref 65–99)
Potassium: 4.3 mmol/L (ref 3.5–5.2)
Sodium: 140 mmol/L (ref 134–144)
Total Protein: 7.4 g/dL (ref 6.0–8.5)

## 2019-03-09 LAB — CBC WITH DIFFERENTIAL/PLATELET
Basophils Absolute: 0.1 10*3/uL (ref 0.0–0.2)
Basos: 1 %
EOS (ABSOLUTE): 0.3 10*3/uL (ref 0.0–0.4)
Eos: 4 %
Hematocrit: 43.3 % (ref 34.0–46.6)
Hemoglobin: 14.6 g/dL (ref 11.1–15.9)
Immature Grans (Abs): 0 10*3/uL (ref 0.0–0.1)
Immature Granulocytes: 0 %
Lymphocytes Absolute: 2.3 10*3/uL (ref 0.7–3.1)
Lymphs: 31 %
MCH: 30.7 pg (ref 26.6–33.0)
MCHC: 33.7 g/dL (ref 31.5–35.7)
MCV: 91 fL (ref 79–97)
Monocytes Absolute: 0.5 10*3/uL (ref 0.1–0.9)
Monocytes: 7 %
Neutrophils Absolute: 4.3 10*3/uL (ref 1.4–7.0)
Neutrophils: 57 %
Platelets: 308 10*3/uL (ref 150–450)
RBC: 4.76 x10E6/uL (ref 3.77–5.28)
RDW: 12.3 % (ref 11.7–15.4)
WBC: 7.5 10*3/uL (ref 3.4–10.8)

## 2019-03-09 LAB — ALPHA-GAL PANEL
Alpha Gal IgE*: <0.1 kU/L (ref <0.10)
Beef (Bos spp) IgE: <0.1 kU/L (ref <0.35)
Class Interpretation: 0
Class Interpretation: 0
Class Interpretation: 0
Lamb/Mutton (Ovis spp) IgE: <0.1 kU/L (ref <0.35)
Pork (Sus spp) IgE: <0.1 kU/L (ref <0.35)

## 2019-03-09 LAB — ALLERGEN PROFILE, FOOD-FISH
Allergen Mackerel IgE: <0.1 kU/L
Allergen Salmon IgE: <0.1 kU/L
Allergen Trout IgE: <0.1 kU/L
Allergen Walley Pike IgE: <0.1 kU/L
Codfish IgE: <0.1 kU/L
Halibut IgE: <0.1 kU/L
Tuna: <0.1 kU/L

## 2019-03-09 LAB — THYROID PEROXIDASE ANTIBODY: Thyroperoxidase Ab SerPl-aCnc: 30 IU/mL (ref 0–34)

## 2019-03-09 LAB — CHRONIC URTICARIA: cu index: <1 (ref <10)

## 2019-03-09 LAB — ALLERGEN SOYBEAN: Soybean IgE: <0.1 kU/L

## 2019-03-09 LAB — SEDIMENTATION RATE: Sed Rate: 9 mm/hr (ref 0–32)

## 2019-03-09 LAB — ANA W/REFLEX: Anti Nuclear Antibody (ANA): NEGATIVE

## 2019-03-09 LAB — THYROGLOBULIN ANTIBODY: Thyroglobulin Antibody: 4.9 IU/mL — ABNORMAL HIGH (ref 0.0–0.9)

## 2019-03-09 LAB — TRYPTASE: Tryptase: 2.6 ug/L (ref 2.2–13.2)

## 2019-06-22 NOTE — L&D Delivery Note (Signed)
Delivery Note At 8:48 PM a viable female was delivered via Vaginal, Spontaneous (Presentation: Left Occiput Anterior).  APGAR: 9, 9; weight pending.   Placenta status: Spontaneous, Intact.  Cord: 3 vessels with the following complications: None.  Cord pH: n/a  Anesthesia: Epidural Episiotomy: None Lacerations: 2nd degree;Periurethral Suture Repair: 3.0 vicryl rapide Est. Blood Loss (mL):  300  Mom to postpartum.  Baby to Couplet care / Skin to Skin.  Linda Hedges 06/11/2020, 9:13 PM

## 2019-06-24 ENCOUNTER — Encounter: Payer: Self-pay | Admitting: Family Medicine

## 2019-06-25 ENCOUNTER — Other Ambulatory Visit: Payer: Self-pay | Admitting: *Deleted

## 2019-06-25 DIAGNOSIS — E039 Hypothyroidism, unspecified: Secondary | ICD-10-CM

## 2019-06-25 MED ORDER — LEVOTHYROXINE SODIUM 75 MCG PO TABS: 75.0000 ug | ORAL_TABLET | Freq: Every day | ORAL | 0 refills | Status: DC

## 2019-06-29 ENCOUNTER — Ambulatory Visit: Payer: 59 | Admitting: Family Medicine

## 2019-07-27 ENCOUNTER — Ambulatory Visit: Payer: 59 | Admitting: Family Medicine

## 2019-07-27 ENCOUNTER — Other Ambulatory Visit: Payer: Self-pay

## 2019-07-27 ENCOUNTER — Encounter: Payer: Self-pay | Admitting: Family Medicine

## 2019-07-27 VITALS — BP 132/84 | HR 93 | Temp 98.2°F | Ht 65.5 in | Wt 181.6 lb

## 2019-07-27 DIAGNOSIS — E039 Hypothyroidism, unspecified: Secondary | ICD-10-CM | POA: Diagnosis not present

## 2019-07-27 DIAGNOSIS — J4520 Mild intermittent asthma, uncomplicated: Secondary | ICD-10-CM

## 2019-07-27 DIAGNOSIS — R030 Elevated blood-pressure reading, without diagnosis of hypertension: Secondary | ICD-10-CM | POA: Diagnosis not present

## 2019-07-27 MED ORDER — LEVOTHYROXINE SODIUM 75 MCG PO TABS: 75.0000 ug | ORAL_TABLET | Freq: Every day | ORAL | 1 refills | Status: DC

## 2019-07-27 NOTE — Patient Instructions (Signed)
° ° ° °  If you have lab work done today you will be contacted with your lab results within the next 2 weeks.  If you have not heard from us then please contact us. The fastest way to get your results is to register for My Chart. ° ° °IF you received an x-ray today, you will receive an invoice from [facility]. Please contact [facility] at [phone] with questions or concerns regarding your invoice.  ° °IF you received labwork today, you will receive an invoice from [facility]. Please contact [facility] at [phone] with questions or concerns regarding your invoice.  ° °Our billing staff will not be able to assist you with questions regarding bills from these companies. ° °You will be contacted with the lab results as soon as they are available. The fastest way to get your results is to activate your My Chart account. Instructions are located on the last page of this paperwork. If you have not heard from us regarding the results in 2 weeks, please contact this office. °  ° ° ° °

## 2019-07-27 NOTE — Progress Notes (Signed)
2/5/20218:37 AM  Christy Rivera [birth year], 29 y.o., female [id number]  Chief Complaint  Patient presents with  . Hypothyroidism    HPI:   Patient is a 29 y.o. female with past medical history significant for HTN, mild intermittent asthma and hypothyrodism who presents today for routine followup  Last OV July 2020 - trial off amlodipine Checks her BP at home about twice a week, usually 120/80s Continues to work on [employer] levo daily, denies any missed doses Denies any changes in temp tolerance, hair/skin, mood, bowel habits, palpitations  Uses albuterol very rarely for exercise induced asthma Denies SOB, cough or wheezing  Sees obgyn yearly  Lab Results  Component Value Date   TSH 1.370 12/29/2018    Depression screen [facility] 2/9 07/27/2019 07/06/2018 04/18/2018  Decreased Interest 0 0 0  Down, Depressed, Hopeless 0 0 0  PHQ - 2 Score 0 0 0    Fall Risk  07/27/2019 07/06/2018 04/18/2018 04/10/2018 03/10/2018  Falls in the past year? 0 0 No No No  Number falls in past yr: 0 - - - -  Injury with Fall? 0 - - - -     No Known Allergies  Prior to Admission medications   Medication Sig Start Date End Date Taking? Authorizing Provider  albuterol (VENTOLIN HFA) 108 (90 Base) MCG/ACT inhaler Inhale 2 puffs into the lungs every 6 (six) hours as needed for wheezing or shortness of breath. 12/29/18  Yes Rutherford Guys, MD  EPINEPHrine (AUVI-Q) 0.3 mg/0.3 mL IJ SOAJ injection Inject 0.3 mLs (0.3 mg total) into the muscle as needed for anaphylaxis. 03/05/19  Yes Bobbitt, Sedalia Muta, MD  fluticasone [facility]) 50 MCG/ACT nasal spray Place 1 spray into both nostrils daily as needed for allergies or rhinitis.   Yes [provider]  levocetirizine (XYZAL) 5 MG tablet Take 5 mg by mouth every evening.   Yes [provider]  levothyroxine (SYNTHROID) 75 MCG tablet Take 1 tablet (75 mcg total) by mouth daily before breakfast. 06/25/19  Yes Rutherford Guys, MD    Past  Medical History:  Diagnosis Date  . Asthma   . Eczema   . Essential hypertension, benign   . Urticaria     Past Surgical History:  Procedure Laterality Date  . NO PAST SURGERIES      Social History   Tobacco Use  . Smoking status: Never Smoker  . Smokeless tobacco: Never Used  Substance Use Topics  . Alcohol use: Yes    Family History  Problem Relation Age of Onset  . Heart disease Mother   . Hyperlipidemia Mother   . Hypertension Mother   . Allergic rhinitis Mother   . Asthma Mother   . Healthy Brother   . Heart disease Paternal Uncle   . Hyperlipidemia Paternal Uncle   . Hypertension Paternal Uncle   . Cancer Maternal Grandfather   . Diabetes Paternal Grandmother     ROS Per hpi  OBJECTIVE:  Today's Vitals   07/27/19 0827  BP: 132/84  Pulse: 93  Temp: 98.2 F (36.8 C)  SpO2: 99%  Weight: 181 lb 9.6 oz (82.4 kg)  Height: 5' 5.5" (1.664 m)   Body mass index is 29.76 kg/m.  Wt Readings from Last 3 Encounters:  07/27/19 181 lb 9.6 oz (82.4 kg)  03/05/19 189 lb (85.7 kg)  12/29/18 201 lb (91.2 kg)   BP Readings from Last 3 Encounters:  07/27/19 132/84  03/05/19 (!) 158/96  12/29/18 124/84  Physical Exam Vitals and nursing note reviewed.  Constitutional:      Appearance: She is well-developed.  HENT:     Head: Normocephalic and atraumatic.     Mouth/Throat:     Pharynx: No oropharyngeal exudate.  Eyes:     General: No scleral icterus.    Conjunctiva/sclera: Conjunctivae normal.     Pupils: Pupils are equal, round, and reactive to light.  Neck:     Thyroid: No thyroid mass, thyromegaly or thyroid tenderness.  Cardiovascular:     Rate and Rhythm: Normal rate and regular rhythm.     Heart sounds: Normal heart sounds. No murmur. No friction rub. No gallop.   Pulmonary:     Effort: Pulmonary effort is normal.     Breath sounds: Normal breath sounds. No wheezing, rhonchi or rales.  Musculoskeletal:     Cervical back: Neck supple.   Skin:    General: Skin is warm and dry.  Neurological:     Mental Status: She is alert and oriented to person, place, and time.     No results found for this or any previous visit (from the past 24 hour(s)).  No results found.   ASSESSMENT and PLAN  1. Hypothyroidism, unspecified type Checking labs today, medications will be adjusted as needed.  - TSH - levothyroxine (SYNTHROID) 75 MCG tablet; Take 1 tablet (75 mcg total) by mouth daily before breakfast.  2. Mild intermittent asthma without complication Controlled. Cont with albuterol prn  3. Elevated blood-pressure reading without diagnosis of hypertension Doing ok off meds. Cont with weight loss, low salt diet and home BP monitoring  Return in about 6 months (around 01/24/2020).    Rutherford Guys, MD Primary Care at [place], [place] [address] Ph.  [phone] Fax [phone]

## 2019-07-28 LAB — TSH: TSH: 1.23 u[IU]/mL (ref 0.450–4.500)

## 2019-08-19 ENCOUNTER — Other Ambulatory Visit: Payer: Self-pay | Admitting: Family Medicine

## 2019-10-24 ENCOUNTER — Encounter: Payer: Self-pay | Admitting: Family Medicine

## 2019-10-25 ENCOUNTER — Other Ambulatory Visit: Payer: Self-pay | Admitting: Family Medicine

## 2019-10-25 DIAGNOSIS — E039 Hypothyroidism, unspecified: Secondary | ICD-10-CM

## 2019-10-26 ENCOUNTER — Ambulatory Visit (INDEPENDENT_AMBULATORY_CARE_PROVIDER_SITE_OTHER): Payer: 59 | Admitting: Family Medicine

## 2019-10-26 ENCOUNTER — Other Ambulatory Visit: Payer: Self-pay

## 2019-10-26 DIAGNOSIS — E039 Hypothyroidism, unspecified: Secondary | ICD-10-CM

## 2019-10-26 NOTE — Progress Notes (Signed)
Lab only visit 

## 2019-10-27 LAB — TSH: TSH: 2.47 u[IU]/mL (ref 0.450–4.500)

## 2019-11-13 LAB — OB RESULTS CONSOLE HEPATITIS B SURFACE ANTIGEN: Hepatitis B Surface Ag: NEGATIVE

## 2019-11-13 LAB — OB RESULTS CONSOLE ANTIBODY SCREEN: Antibody Screen: NEGATIVE

## 2019-11-13 LAB — OB RESULTS CONSOLE RUBELLA ANTIBODY, IGM: Rubella: IMMUNE

## 2019-11-13 LAB — OB RESULTS CONSOLE RPR: RPR: NONREACTIVE

## 2019-11-13 LAB — OB RESULTS CONSOLE GC/CHLAMYDIA
Chlamydia: NEGATIVE
Gonorrhea: NEGATIVE

## 2019-11-13 LAB — OB RESULTS CONSOLE HIV ANTIBODY (ROUTINE TESTING): HIV: NONREACTIVE

## 2019-11-13 LAB — OB RESULTS CONSOLE ABO/RH: RH Type: POSITIVE

## 2020-01-19 IMAGING — CT CT ANGIO ABDOMEN
2 of 3 series · 12 of 32 positions shown, 17 images · IV contrast (APPLIED)
Comparison: Renal duplex ultrasound - 04/05/2018

CLINICAL DATA: Uncontrolled hypertension. Abnormal renal Doppler
ultrasound. Evaluate for renal artery stenosis.

EXAM:
CT ANGIOGRAPHY ABDOMEN
TECHNIQUE: Multidetector CT imaging of the abdomen was performed using the
standard protocol during bolus administration of intravenous
contrast. Multiplanar reconstructed images and MIPs were obtained
and reviewed to evaluate the vascular anatomy.
CONTRAST:  80mL 2C4DDA-0EZ IOPAMIDOL (2C4DDA-0EZ) INJECTION 76%

[Series 4: renal angio · axial · 0.78mm/px · z∈[-302,-194]mm · 4 of 104 slices shown]
[im 13/104  soft-tissue]
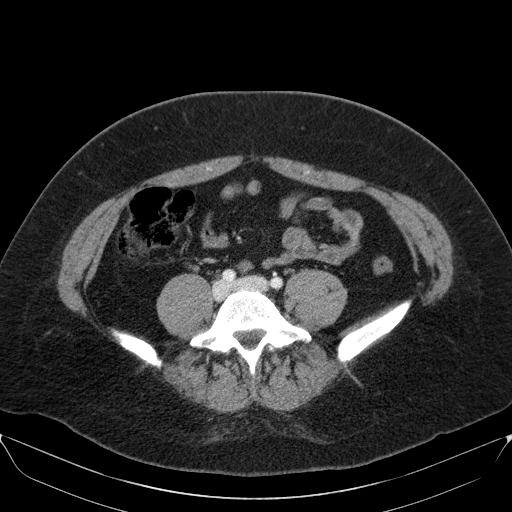
[im 25/104  soft-tissue]
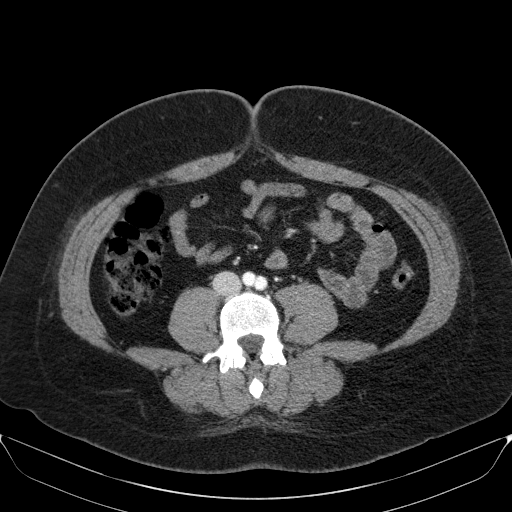
[im 37/104  soft-tissue]
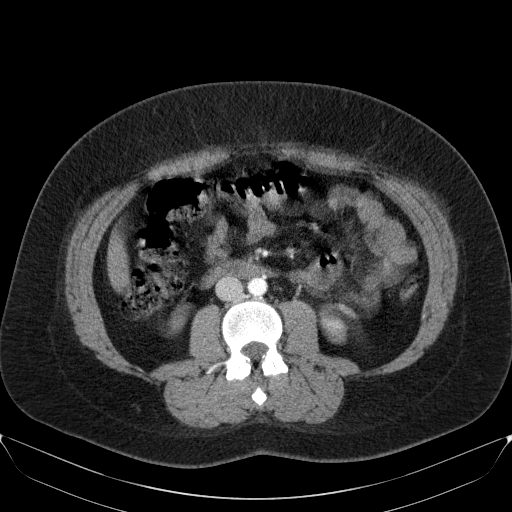
[im 49/104  soft-tissue]
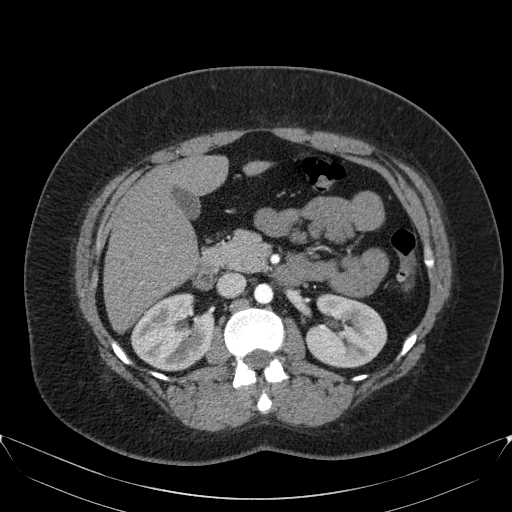

[Series 5: venous · axial · portal-venous · 0.78mm/px · z∈[-310,-64]mm · 8 of 63 slices shown, 13 images]
[im 7/63  soft-tissue]
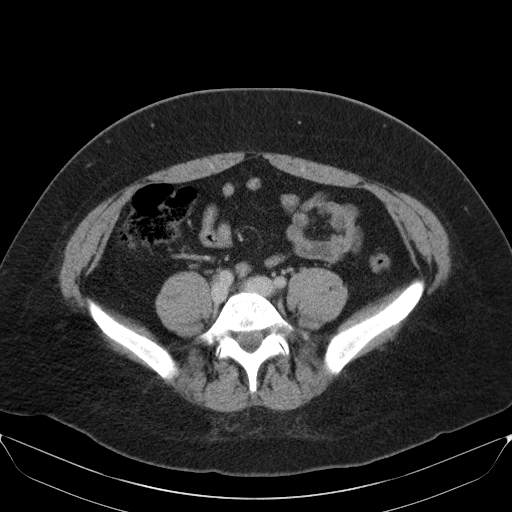
[im 7/63  bone]
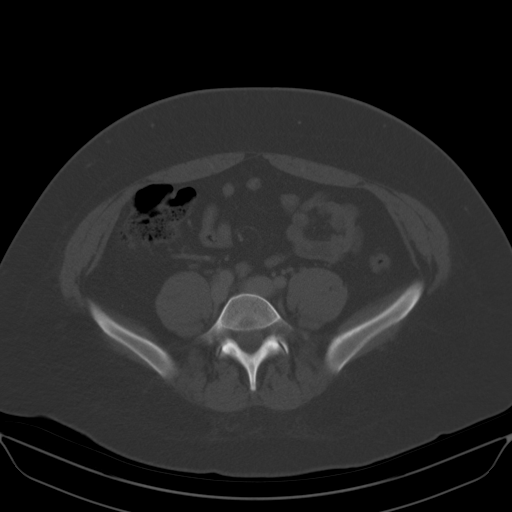
[im 14/63  soft-tissue]
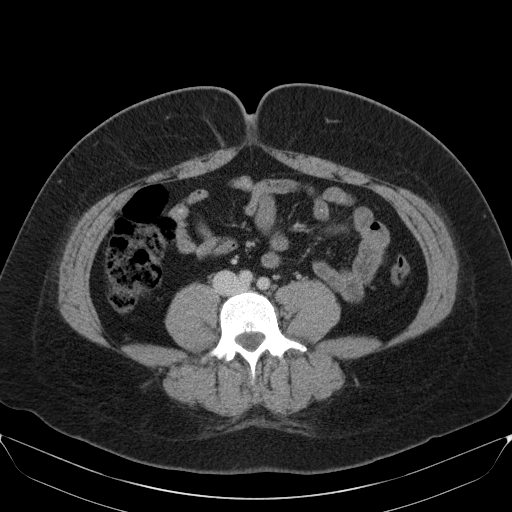
[im 21/63  soft-tissue]
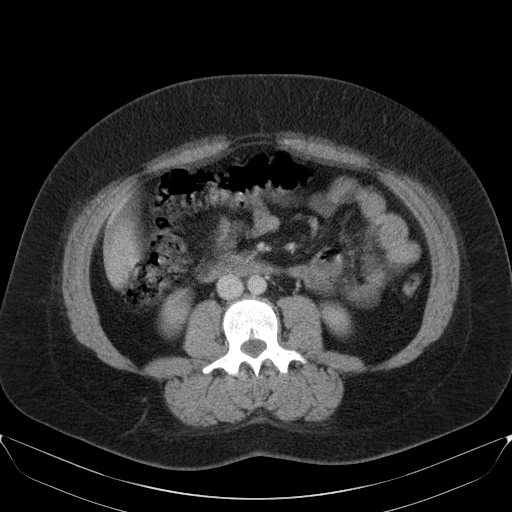
[im 28/63  soft-tissue]
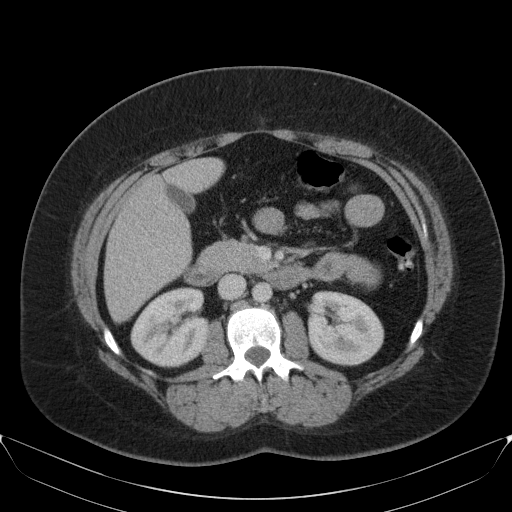
[im 35/63  soft-tissue]
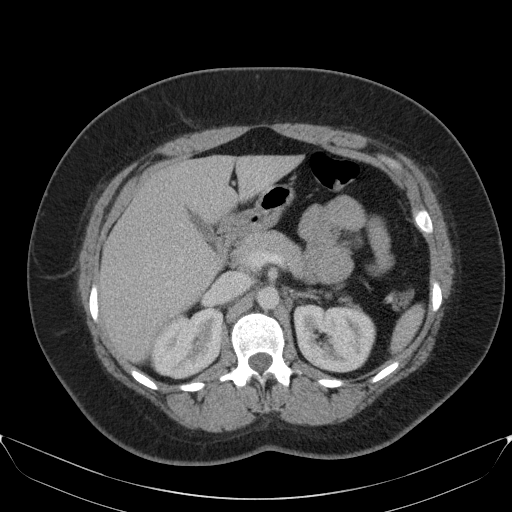
[im 35/63  lung]
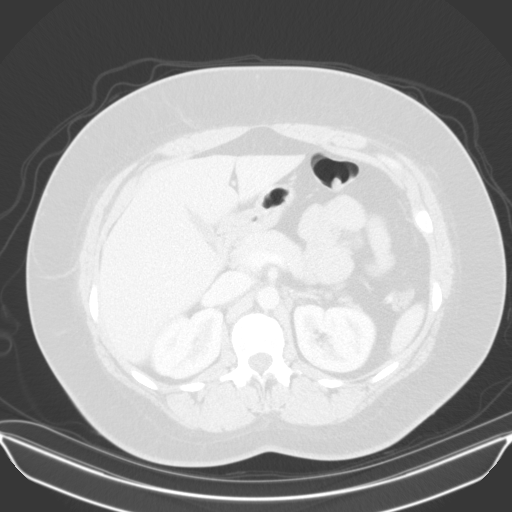
[im 42/63  soft-tissue]
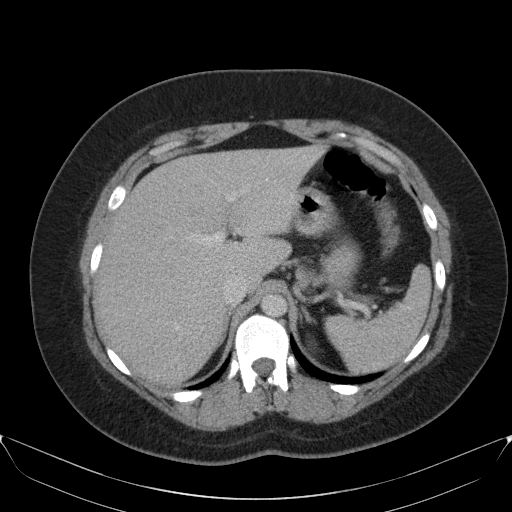
[im 42/63  lung]
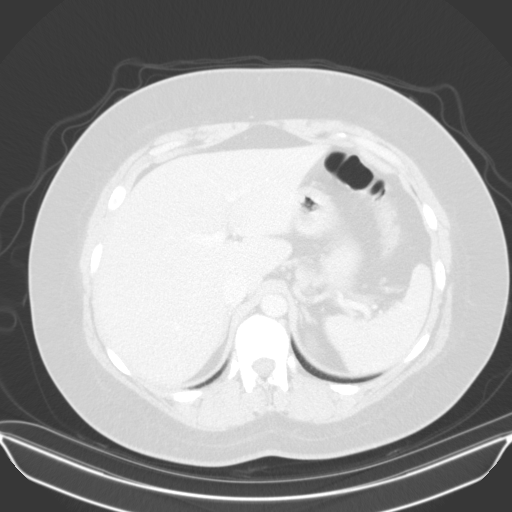
[im 49/63  soft-tissue]
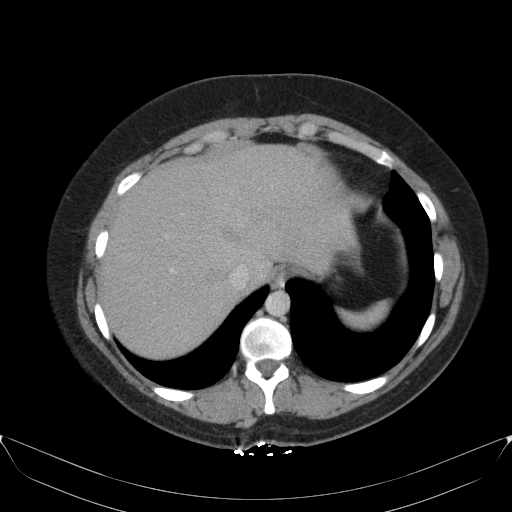
[im 49/63  lung]
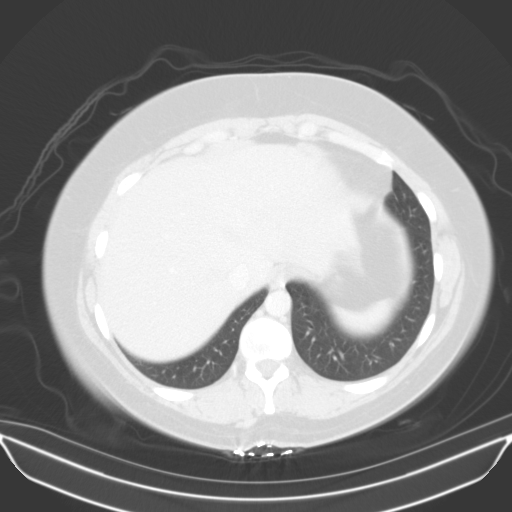
[im 56/63  soft-tissue]
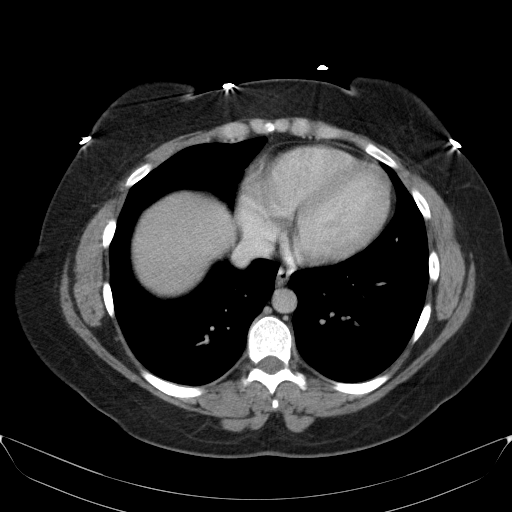
[im 56/63  lung]
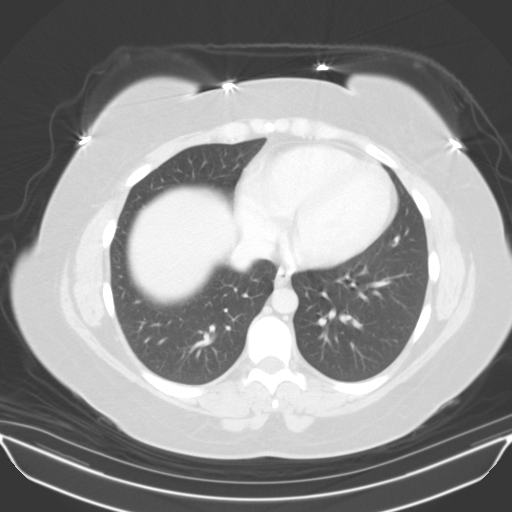

[12 of 32 positions shown; findings below may reference images not displayed]

FINDINGS: VASCULAR

Aorta: Normal caliber of the abdominal aorta. There is no
significant atherosclerotic plaque within the abdominal aorta. No
abdominal aortic dissection or periaortic stranding.

Celiac: Widely patent without hemodynamically significant narrowing.
Conventional branching pattern.

SMA: Widely patent without hemodynamically significant narrowing.
Conventional branching pattern.

Renals: Note is made of 3 separate left-sided renal arteries with
tiny accessory renal artery arising cranial to the dominant left
renal artery (coronal image 64, series 7) which supplies the
anterior interpolar aspect of the left kidney as well as an
additional accessory renal artery which supplies the inferior pole
of the left kidney (coronal image 62, series 7).

Solitary right-sided renal artery with early bifurcation.

The bilateral renal arteries are widely patent without
hemodynamically significant stenosis. No discrete areas of vessel
irregularity to suggest FMD.

No asymmetric renal atrophy or delayed renal enhancement.

IMA: Widely patent.

Inflow: The bilateral common imaged aspects of the bilateral
external and internal iliac arteries are of normal caliber and
widely patent without hemodynamically significant stenosis.

Veins: The IVC and proximal pelvic venous system appear widely
patent. The renal veins appear widely patent bilaterally.

Review of the MIP images confirms the above findings.

_________________________________________________________

NON-VASCULAR

Lower chest: Limited visualization of the lower thorax is negative
for focal airspace opacity or pleural effusion. Normal heart size.
No pericardial effusion.

Hepatobiliary: Normal hepatic contour. No discrete hepatic lesions.
Normal appearance of the gallbladder given degree distention. No
radiopaque gallstones. No intra or extrahepatic biliary duct
dilatation. No ascites.

Pancreas: Normal appearance of the pancreas.

Spleen: Normal appearance of the spleen

Adrenals/Urinary Tract: There is symmetric enhancement of the
bilateral kidneys. No definite renal stones this postcontrast
examination. No discrete renal lesions, though note, evaluation of
the inferior poles of the bilateral kidneys is degraded secondary to
patient respiratory artifact. No urinary obstruction or perinephric
stranding.

Normal appearance the bilateral adrenal glands.

The urinary bladder was not imaged.

Stomach/Bowel: Scattered colonic diverticulosis involving the
splenic flexure of the colon. Moderate colonic stool burden without
evidence of enteric obstruction. No pneumoperitoneum, pneumatosis or
portal venous gas.

Lymphatic: No bulky retroperitoneal or mesenteric lymphadenopathy.

Other: Tiny mesenteric fat containing periumbilical hernia.

Musculoskeletal: No acute or aggressive osseous abnormalities.
IMPRESSION: 1. No evidence of renal artery stenosis or fibromuscular dysplasia
(FMD).
2. Incidentally noted 2 accessory left-sided renal arteries.
3. Scattered colonic diverticulosis without diverticulitis.

## 2020-01-24 ENCOUNTER — Ambulatory Visit (INDEPENDENT_AMBULATORY_CARE_PROVIDER_SITE_OTHER): Payer: 59 | Admitting: Family Medicine

## 2020-01-24 ENCOUNTER — Other Ambulatory Visit: Payer: Self-pay

## 2020-01-24 ENCOUNTER — Telehealth: Payer: 59 | Admitting: Family Medicine

## 2020-01-24 ENCOUNTER — Encounter: Payer: 59 | Admitting: Family Medicine

## 2020-01-24 DIAGNOSIS — E039 Hypothyroidism, unspecified: Secondary | ICD-10-CM

## 2020-01-25 ENCOUNTER — Telehealth (INDEPENDENT_AMBULATORY_CARE_PROVIDER_SITE_OTHER): Payer: 59 | Admitting: Family Medicine

## 2020-01-25 DIAGNOSIS — E039 Hypothyroidism, unspecified: Secondary | ICD-10-CM | POA: Diagnosis not present

## 2020-01-25 LAB — TSH: TSH: 2.66 u[IU]/mL (ref 0.450–4.500)

## 2020-01-25 MED ORDER — LEVOTHYROXINE SODIUM 75 MCG PO TABS: 75.0000 ug | ORAL_TABLET | Freq: Every day | ORAL | 2 refills | Status: DC

## 2020-01-25 NOTE — Progress Notes (Signed)
Virtual Visit Note  I connected with patient on 01/25/20 at 532pm by phone due to technical difficulties and verified that I am speaking with the correct person using two identifiers. Christy Rivera is currently located at home and patient is currently with them during visit. The provider, Rutherford Guys, MD is located in their office at time of visit.  I discussed the limitations, risks, security and privacy concerns of performing an evaluation and management service by telephone and the availability of in person appointments. I also discussed with the patient that there may be a patient responsible charge related to this service. The patient expressed understanding and agreed to proceed.   I provided 8 minutes of non-face-to-face time during this encounter.  Chief Complaint  Patient presents with  . Hypothyroidism    labs labs that were drawn on Monday    HPI ? Patient is requesting refill for levothyroxine She is actually pregnant obgyn Dr Lynnette Caffey at [facility] Saw her ob gyn today, [redacted]w[redacted]d Per patient, she saw recent TSH, ok with level Patient requesting refill of medication today (levo 35mcg) States that obgyn will take over checking and refills next 3rd T Patient otherwise doing well  She has no acute concerns today  No Known Allergies  Prior to Admission medications   Medication Sig Start Date End Date Taking? Authorizing Provider  albuterol (VENTOLIN HFA) 108 (90 Base) MCG/ACT inhaler Inhale 2 puffs into the lungs every 6 (six) hours as needed for wheezing or shortness of breath. 12/29/18  Yes Rutherford Guys, MD  Docosahexaenoic Acid (PRENATAL DHA) 200 MG CAPS Prenatal + DHA   Yes [provider]  EPINEPHrine (AUVI-Q) 0.3 mg/0.3 mL IJ SOAJ injection Inject 0.3 mLs (0.3 mg total) into the muscle as needed for anaphylaxis. 03/05/19  Yes Bobbitt, Sed[place] Muta, MD  fluticasone [facility]) 50 MCG/ACT nasal spray Place 1 spray into both nostrils daily as needed  for allergies or rhinitis.   Yes [provider]  levocetirizine (XYZAL) 5 MG tablet Take 5 mg by mouth every evening.   Yes [provider]  levothyroxine (SYNTHROID) 75 MCG tablet Take 1 tablet (75 mcg total) by mouth daily before breakfast. 01/25/20  Yes Rutherford Guys, MD    Past Medical History:  Diagnosis Date  . Asthma   . Eczema   . Essential hypertension, benign   . Urticaria     Past Surgical History:  Procedure Laterality Date  . NO PAST SURGERIES      Social History   Tobacco Use  . Smoking status: Never Smoker  . Smokeless tobacco: Never Used  Substance Use Topics  . Alcohol use: Yes    Family History  Problem Relation Age of Onset  . Heart disease Mother   . Hyperlipidemia Mother   . Hypertension Mother   . Allergic rhinitis Mother   . Asthma Mother   . Healthy Brother   . Heart disease Paternal Uncle   . Hyperlipidemia Paternal Uncle   . Hypertension Paternal Uncle   . Cancer Maternal Grandfather   . Diabetes Paternal Grandmother     ROS Per hpi  Objective  Vitals as reported by the patient: none    ASSESSMENT and PLAN  1. Hypothyroidism, unspecified type Med refilled. Discussed ob to resume further care while pregnant. - levothyroxine (SYNTHROID) 75 MCG tablet; Take 1 tablet (75 mcg total) by mouth daily before breakfast.  FOLLOW-UP: after PP period   The above assessment and management plan was  discussed with the patient. The patient verbalized understanding of and has agreed to the management plan. Patient is aware to call the clinic if symptoms persist or worsen. Patient is aware when to return to the clinic for a follow-up visit. Patient educated on when it is appropriate to go to the emergency department.     Rutherford Guys, MD Primary Care at [place], [place] [address] Ph.  [phone] Fax [phone]

## 2020-01-25 NOTE — Patient Instructions (Signed)
° ° ° °  If you have lab work done today you will be contacted with your lab results within the next 2 weeks.  If you have not heard from us then please contact us. The fastest way to get your results is to register for My Chart. ° ° °IF you received an x-ray today, you will receive an invoice from [facility]. Please contact [facility] at [phone] with questions or concerns regarding your invoice.  ° °IF you received labwork today, you will receive an invoice from [facility]. Please contact [facility] at [phone] with questions or concerns regarding your invoice.  ° °Our billing staff will not be able to assist you with questions regarding bills from these companies. ° °You will be contacted with the lab results as soon as they are available. The fastest way to get your results is to activate your My Chart account. Instructions are located on the last page of this paperwork. If you have not heard from us regarding the results in 2 weeks, please contact this office. °  ° ° ° °

## 2020-03-20 NOTE — Progress Notes (Signed)
This encounter was created in error - please disregard.

## 2020-04-22 ENCOUNTER — Other Ambulatory Visit: Payer: Self-pay

## 2020-04-22 DIAGNOSIS — E039 Hypothyroidism, unspecified: Secondary | ICD-10-CM

## 2020-04-22 MED ORDER — LEVOTHYROXINE SODIUM 75 MCG PO TABS: 75.0000 ug | ORAL_TABLET | Freq: Every day | ORAL | 2 refills | Status: DC

## 2020-05-21 LAB — OB RESULTS CONSOLE GBS: GBS: POSITIVE

## 2020-05-28 ENCOUNTER — Telehealth (HOSPITAL_COMMUNITY): Payer: Self-pay | Admitting: *Deleted

## 2020-05-28 ENCOUNTER — Encounter (HOSPITAL_COMMUNITY): Payer: Self-pay | Admitting: *Deleted

## 2020-05-28 NOTE — Telephone Encounter (Signed)
Preadmission screen  

## 2020-05-30 ENCOUNTER — Encounter (HOSPITAL_COMMUNITY): Payer: Self-pay | Admitting: *Deleted

## 2020-06-09 ENCOUNTER — Other Ambulatory Visit (HOSPITAL_COMMUNITY)
Admission: RE | Admit: 2020-06-09 | Discharge: 2020-06-09 | Disposition: A | Discharge status: Home / Self Care | Payer: 59 | Source: Ambulatory Visit | Attending: Obstetrics & Gynecology | Admitting: Obstetrics & Gynecology

## 2020-06-09 DIAGNOSIS — Z20822 Contact with and (suspected) exposure to covid-19: Secondary | ICD-10-CM | POA: Insufficient documentation

## 2020-06-09 DIAGNOSIS — Z01812 Encounter for preprocedural laboratory examination: Secondary | ICD-10-CM | POA: Insufficient documentation

## 2020-06-09 LAB — SARS CORONAVIRUS 2 (TAT 6-24 HRS): SARS Coronavirus 2: NEGATIVE

## 2020-06-11 ENCOUNTER — Encounter (HOSPITAL_COMMUNITY): Payer: Self-pay | Admitting: Obstetrics & Gynecology

## 2020-06-11 ENCOUNTER — Inpatient Hospital Stay (HOSPITAL_COMMUNITY): Payer: 59

## 2020-06-11 ENCOUNTER — Inpatient Hospital Stay (HOSPITAL_COMMUNITY)
Admission: AD | Admit: 2020-06-11 | Discharge: 2020-06-13 | DRG: 807 | Disposition: A | Discharge status: Home / Self Care | Payer: 59 | Attending: Obstetrics & Gynecology | Admitting: Obstetrics & Gynecology

## 2020-06-11 ENCOUNTER — Inpatient Hospital Stay (HOSPITAL_COMMUNITY): Payer: 59 | Admitting: Anesthesiology

## 2020-06-11 ENCOUNTER — Other Ambulatory Visit: Payer: Self-pay

## 2020-06-11 DIAGNOSIS — O99284 Endocrine, nutritional and metabolic diseases complicating childbirth: Secondary | ICD-10-CM | POA: Diagnosis present

## 2020-06-11 DIAGNOSIS — I10 Essential (primary) hypertension: Secondary | ICD-10-CM

## 2020-06-11 DIAGNOSIS — E039 Hypothyroidism, unspecified: Secondary | ICD-10-CM | POA: Diagnosis present

## 2020-06-11 DIAGNOSIS — O99824 Streptococcus B carrier state complicating childbirth: Secondary | ICD-10-CM | POA: Diagnosis present

## 2020-06-11 DIAGNOSIS — O1002 Pre-existing essential hypertension complicating childbirth: Secondary | ICD-10-CM | POA: Diagnosis present

## 2020-06-11 DIAGNOSIS — Z20822 Contact with and (suspected) exposure to covid-19: Secondary | ICD-10-CM | POA: Diagnosis present

## 2020-06-11 DIAGNOSIS — J4520 Mild intermittent asthma, uncomplicated: Secondary | ICD-10-CM | POA: Diagnosis present

## 2020-06-11 DIAGNOSIS — O9952 Diseases of the respiratory system complicating childbirth: Secondary | ICD-10-CM | POA: Diagnosis present

## 2020-06-11 DIAGNOSIS — Z3A38 38 weeks gestation of pregnancy: Secondary | ICD-10-CM | POA: Diagnosis not present

## 2020-06-11 HISTORY — DX: Essential (primary) hypertension: I10

## 2020-06-11 LAB — CBC
HCT: 37.8 % (ref 36.0–46.0)
HCT: 37.8 % (ref 36.0–46.0)
Hemoglobin: 12.5 g/dL (ref 12.0–15.0)
Hemoglobin: 12.4 g/dL (ref 12.0–15.0)
MCH: 30.2 pg (ref 26.0–34.0)
MCH: 29.8 pg (ref 26.0–34.0)
MCHC: 33.1 g/dL (ref 30.0–36.0)
MCHC: 32.8 g/dL (ref 30.0–36.0)
MCV: 91.3 fL (ref 80.0–100.0)
MCV: 90.9 fL (ref 80.0–100.0)
Platelets: 345 10*3/uL (ref 150–400)
Platelets: 320 10*3/uL (ref 150–400)
RBC: 4.14 MIL/uL (ref 3.87–5.11)
RBC: 4.16 MIL/uL (ref 3.87–5.11)
RDW: 13.1 % (ref 11.5–15.5)
RDW: 12.9 % (ref 11.5–15.5)
WBC: 13.7 10*3/uL — ABNORMAL HIGH (ref 4.0–10.5)
WBC: 21.3 10*3/uL — ABNORMAL HIGH (ref 4.0–10.5)
nRBC: 0 % (ref 0.0–0.2)
nRBC: 0 % (ref 0.0–0.2)

## 2020-06-11 LAB — COMPREHENSIVE METABOLIC PANEL
ALT: 14 U/L (ref 0–44)
AST: 20 U/L (ref 15–41)
Albumin: 2.9 g/dL — ABNORMAL LOW (ref 3.5–5.0)
Alkaline Phosphatase: 94 U/L (ref 38–126)
Anion gap: 11 (ref 5–15)
BUN: 10 mg/dL (ref 6–20)
CO2: 20 mmol/L — ABNORMAL LOW (ref 22–32)
Calcium: 8.6 mg/dL — ABNORMAL LOW (ref 8.9–10.3)
Chloride: 102 mmol/L (ref 98–111)
Creatinine, Ser: 0.55 mg/dL (ref 0.44–1.00)
GFR, Estimated: >60 mL/min (ref >60)
Glucose, Bld: 108 mg/dL — ABNORMAL HIGH (ref 70–99)
Potassium: 3.7 mmol/L (ref 3.5–5.1)
Sodium: 133 mmol/L — ABNORMAL LOW (ref 135–145)
Total Bilirubin: 0.2 mg/dL — ABNORMAL LOW (ref 0.3–1.2)
Total Protein: 6.2 g/dL — ABNORMAL LOW (ref 6.5–8.1)

## 2020-06-11 LAB — RPR: RPR Ser Ql: NONREACTIVE

## 2020-06-11 LAB — TYPE AND SCREEN
ABO/RH(D): A POS
Antibody Screen: NEGATIVE

## 2020-06-11 LAB — PLATELET COUNT: Platelets: 314 10*3/uL (ref 150–400)

## 2020-06-11 MED ORDER — ACETAMINOPHEN 325 MG PO TABS
650.0000 mg | ORAL_TABLET | ORAL | Status: DC | PRN
  Administered 2020-06-12: 650 mg via ORAL
  Filled 2020-06-11: qty 2

## 2020-06-11 MED ORDER — ZOLPIDEM TARTRATE 5 MG PO TABS: 5.0000 mg | ORAL_TABLET | Freq: Every evening | ORAL | Status: DC | PRN

## 2020-06-11 MED ORDER — PHENYLEPHRINE 40 MCG/ML (10ML) SYRINGE FOR IV PUSH (FOR BLOOD PRESSURE SUPPORT): 80.0000 ug | PREFILLED_SYRINGE | INTRAVENOUS | Status: DC | PRN

## 2020-06-11 MED ORDER — PENICILLIN G POT IN DEXTROSE 60000 UNIT/ML IV SOLN
3.0000 10*6.[IU] | INTRAVENOUS | Status: DC
  Administered 2020-06-11 (×4): 3 10*6.[IU] via INTRAVENOUS
  Filled 2020-06-11 (×4): qty 50

## 2020-06-11 MED ORDER — DIBUCAINE (PERIANAL) 1 % EX OINT: 1.0000 "application " | TOPICAL_OINTMENT | CUTANEOUS | Status: DC | PRN

## 2020-06-11 MED ORDER — LEVOCETIRIZINE DIHYDROCHLORIDE 5 MG PO TABS: 5.0000 mg | ORAL_TABLET | Freq: Every evening | ORAL | Status: DC

## 2020-06-11 MED ORDER — FENTANYL-BUPIVACAINE-NACL 0.5-0.125-0.9 MG/250ML-% EP SOLN
12.0000 mL/h | EPIDURAL | Status: DC | PRN
  Filled 2020-06-11: qty 250

## 2020-06-11 MED ORDER — TETANUS-DIPHTH-ACELL PERTUSSIS 5-2.5-18.5 LF-MCG/0.5 IM SUSY: 0.5000 mL | PREFILLED_SYRINGE | Freq: Once | INTRAMUSCULAR | Status: DC

## 2020-06-11 MED ORDER — LACTATED RINGERS IV SOLN: 500.0000 mL | Freq: Once | INTRAVENOUS | Status: DC

## 2020-06-11 MED ORDER — IBUPROFEN 600 MG PO TABS
600.0000 mg | ORAL_TABLET | Freq: Four times a day (QID) | ORAL | Status: DC
  Administered 2020-06-12 – 2020-06-13 (×5): 600 mg via ORAL
  Filled 2020-06-11 (×5): qty 1

## 2020-06-11 MED ORDER — LORATADINE 10 MG PO TABS
10.0000 mg | ORAL_TABLET | Freq: Every day | ORAL | Status: DC
  Administered 2020-06-11 – 2020-06-12 (×2): 10 mg via ORAL
  Filled 2020-06-11 (×3): qty 1

## 2020-06-11 MED ORDER — EPHEDRINE 5 MG/ML INJ: 10.0000 mg | INTRAVENOUS | Status: DC | PRN

## 2020-06-11 MED ORDER — TERBUTALINE SULFATE 1 MG/ML IJ SOLN: 0.2500 mg | Freq: Once | INTRAMUSCULAR | Status: DC | PRN

## 2020-06-11 MED ORDER — DIPHENHYDRAMINE HCL 25 MG PO CAPS
25.0000 mg | ORAL_CAPSULE | Freq: Four times a day (QID) | ORAL | Status: DC | PRN
Start: 2020-06-11 — End: 2020-06-13

## 2020-06-11 MED ORDER — LIDOCAINE HCL (PF) 1 % IJ SOLN: 30.0000 mL | INTRAMUSCULAR | Status: DC | PRN

## 2020-06-11 MED ORDER — DIPHENHYDRAMINE HCL 50 MG/ML IJ SOLN: 12.5000 mg | INTRAMUSCULAR | Status: DC | PRN

## 2020-06-11 MED ORDER — OXYTOCIN-SODIUM CHLORIDE 30-0.9 UT/500ML-% IV SOLN: 2.5000 [IU]/h | INTRAVENOUS | Status: DC

## 2020-06-11 MED ORDER — SOD CITRATE-CITRIC ACID 500-334 MG/5ML PO SOLN: 30.0000 mL | ORAL | Status: DC | PRN

## 2020-06-11 MED ORDER — ZOLPIDEM TARTRATE 5 MG PO TABS
5.0000 mg | ORAL_TABLET | Freq: Every evening | ORAL | Status: DC | PRN
  Administered 2020-06-11: 01:00:00 5 mg via ORAL
  Filled 2020-06-11: qty 1

## 2020-06-11 MED ORDER — OXYCODONE-ACETAMINOPHEN 5-325 MG PO TABS
2.0000 | ORAL_TABLET | ORAL | Status: DC | PRN
Start: 2020-06-11 — End: 2020-06-13

## 2020-06-11 MED ORDER — SODIUM CHLORIDE 0.9 % IV SOLN
5.0000 10*6.[IU] | Freq: Once | INTRAVENOUS | Status: AC
  Administered 2020-06-11: 01:00:00 5 10*6.[IU] via INTRAVENOUS
  Filled 2020-06-11: qty 5

## 2020-06-11 MED ORDER — FLUTICASONE PROPIONATE 50 MCG/ACT NA SUSP
1.0000 | Freq: Every day | NASAL | Status: DC | PRN
  Filled 2020-06-11: qty 16

## 2020-06-11 MED ORDER — ONDANSETRON HCL 4 MG/2ML IJ SOLN: 4.0000 mg | Freq: Four times a day (QID) | INTRAMUSCULAR | Status: DC | PRN

## 2020-06-11 MED ORDER — BENZOCAINE-MENTHOL 20-0.5 % EX AERO
1.0000 "application " | INHALATION_SPRAY | CUTANEOUS | Status: DC | PRN
  Administered 2020-06-12 – 2020-06-13 (×2): 1 via TOPICAL
  Filled 2020-06-11 (×2): qty 56

## 2020-06-11 MED ORDER — LIDOCAINE HCL (PF) 1 % IJ SOLN
INTRAMUSCULAR | Status: DC | PRN
  Administered 2020-06-11: 10 mL via EPIDURAL

## 2020-06-11 MED ORDER — SIMETHICONE 80 MG PO CHEW: 80.0000 mg | CHEWABLE_TABLET | ORAL | Status: DC | PRN

## 2020-06-11 MED ORDER — OXYTOCIN BOLUS FROM INFUSION
333.0000 mL | Freq: Once | INTRAVENOUS | Status: AC
  Administered 2020-06-11: 21:00:00 333 mL via INTRAVENOUS

## 2020-06-11 MED ORDER — LEVOTHYROXINE SODIUM 75 MCG PO TABS
75.0000 ug | ORAL_TABLET | Freq: Every day | ORAL | Status: DC
  Administered 2020-06-11: 09:00:00 75 ug via ORAL
  Filled 2020-06-11 (×2): qty 1

## 2020-06-11 MED ORDER — SENNOSIDES-DOCUSATE SODIUM 8.6-50 MG PO TABS
2.0000 | ORAL_TABLET | Freq: Every day | ORAL | Status: DC
  Administered 2020-06-12 – 2020-06-13 (×2): 2 via ORAL
  Filled 2020-06-11 (×3): qty 2

## 2020-06-11 MED ORDER — ACETAMINOPHEN 325 MG PO TABS: 650.0000 mg | ORAL_TABLET | ORAL | Status: DC | PRN

## 2020-06-11 MED ORDER — LEVOTHYROXINE SODIUM 75 MCG PO TABS
75.0000 ug | ORAL_TABLET | Freq: Every day | ORAL | Status: DC
  Filled 2020-06-11: qty 1

## 2020-06-11 MED ORDER — PRENATAL MULTIVITAMIN CH
1.0000 | ORAL_TABLET | Freq: Every day | ORAL | Status: DC
  Administered 2020-06-12: 13:00:00 1 via ORAL
  Filled 2020-06-11: qty 1

## 2020-06-11 MED ORDER — MISOPROSTOL 25 MCG QUARTER TABLET
25.0000 ug | ORAL_TABLET | ORAL | Status: DC | PRN
  Administered 2020-06-11 (×2): 25 ug via VAGINAL
  Filled 2020-06-11 (×2): qty 1

## 2020-06-11 MED ORDER — ONDANSETRON HCL 4 MG PO TABS: 4.0000 mg | ORAL_TABLET | ORAL | Status: DC | PRN

## 2020-06-11 MED ORDER — OXYCODONE-ACETAMINOPHEN 5-325 MG PO TABS: 1.0000 | ORAL_TABLET | ORAL | Status: DC | PRN

## 2020-06-11 MED ORDER — OXYTOCIN-SODIUM CHLORIDE 30-0.9 UT/500ML-% IV SOLN
1.0000 m[IU]/min | INTRAVENOUS | Status: DC
  Administered 2020-06-11: 13:00:00 2 m[IU]/min via INTRAVENOUS
  Filled 2020-06-11: qty 500

## 2020-06-11 MED ORDER — WITCH HAZEL-GLYCERIN EX PADS: 1.0000 "application " | MEDICATED_PAD | CUTANEOUS | Status: DC | PRN

## 2020-06-11 MED ORDER — LACTATED RINGERS IV SOLN: 500.0000 mL | INTRAVENOUS | Status: DC | PRN

## 2020-06-11 MED ORDER — COCONUT OIL OIL: 1.0000 "application " | TOPICAL_OIL | Status: DC | PRN

## 2020-06-11 MED ORDER — ALBUTEROL SULFATE (2.5 MG/3ML) 0.083% IN NEBU: 3.0000 mL | INHALATION_SOLUTION | Freq: Four times a day (QID) | RESPIRATORY_TRACT | Status: DC | PRN

## 2020-06-11 MED ORDER — LABETALOL HCL 200 MG PO TABS
200.0000 mg | ORAL_TABLET | Freq: Two times a day (BID) | ORAL | Status: DC
  Administered 2020-06-11 – 2020-06-13 (×4): 200 mg via ORAL
  Filled 2020-06-11 (×4): qty 1

## 2020-06-11 MED ORDER — OXYCODONE-ACETAMINOPHEN 5-325 MG PO TABS: 2.0000 | ORAL_TABLET | ORAL | Status: DC | PRN

## 2020-06-11 MED ORDER — ONDANSETRON HCL 4 MG/2ML IJ SOLN: 4.0000 mg | INTRAMUSCULAR | Status: DC | PRN

## 2020-06-11 MED ORDER — LACTATED RINGERS IV SOLN: INTRAVENOUS | Status: DC

## 2020-06-11 MED ORDER — SODIUM CHLORIDE (PF) 0.9 % IJ SOLN
INTRAMUSCULAR | Status: DC | PRN
  Administered 2020-06-11: 12 mL/h via EPIDURAL

## 2020-06-11 MED ORDER — FENTANYL CITRATE (PF) 100 MCG/2ML IJ SOLN: 50.0000 ug | INTRAMUSCULAR | Status: DC | PRN

## 2020-06-11 NOTE — Progress Notes (Signed)
Breann Losano is a 29 y.o. G1P0 at [redacted]w[redacted]d by ultrasound admitted for induction of labor due to Hypertension.  Subjective: No c/o.  Comfortable with CLEA; just placed.  NO HA, vision change, RUQ pain, CP/SOB  Objective: BP (!) 147/87   Pulse 79   Temp 98 F (36.7 C) (Oral)   Resp 16   Ht 5\' 6"  (1.676 m)   Wt 111.5 kg   LMP 07/03/2019   BMI 39.67 kg/m  No intake/output data recorded. No intake/output data recorded.  FHT:  FHR: 135 bpm, variability: moderate,  accelerations:  Present,  decelerations:  Absent UC:   regular, every 2-3 minutes SVE:  4/75/-2  Labs: Lab Results  Component Value Date   WBC 13.7 (H) 06/11/2020   HGB 12.5 06/11/2020   HCT 37.8 06/11/2020   MCV 91.3 06/11/2020   PLT 314 06/11/2020    Assessment / Plan: Induction of labor due to [facility],  progressing well on pitocin  Labor: Progressing normally Preeclampsia:  n/a Fetal Wellbeing:  Category I Pain Control:  Epidural I/D:  n/a Anticipated MOD:  NSVD  Linda Hedges 06/11/2020, 12:49 PM

## 2020-06-11 NOTE — Progress Notes (Signed)
CVX 3/50/-2, AROM clear Will start pitocin  Linda Hedges, DO

## 2020-06-11 NOTE — Anesthesia Procedure Notes (Signed)
Epidural Patient location during procedure: OB Start time: 06/11/2020 11:55 AM End time: 06/11/2020 12:05 PM  Staffing Anesthesiologist: Merlinda Frederick, MD Performed: anesthesiologist   Preanesthetic Checklist Completed: patient identified, IV checked, site marked, risks and benefits discussed, monitors and equipment checked, pre-op evaluation and timeout performed  Epidural Patient position: sitting Prep: DuraPrep Patient monitoring: heart rate, cardiac monitor, continuous pulse ox and blood pressure Approach: midline Location: L3-L4 Injection technique: LOR saline  Needle:  Needle type: Tuohy  Needle gauge: 17 G Needle length: 9 cm Needle insertion depth: 6 cm Catheter type: closed end flexible Catheter size: 20 Guage Catheter at skin depth: 11 cm Test dose: negative and Other  Assessment Events: blood not aspirated, injection not painful, no injection resistance and negative IV test  Additional Notes Informed consent obtained prior to proceeding including risk of failure, 1% risk of PDPH, risk of minor discomfort and bruising.  Discussed rare but serious complications including epidural abscess, permanent nerve injury, epidural hematoma.  Discussed alternatives to epidural analgesia and patient desires to proceed.  Timeout performed pre-procedure verifying patient name, procedure, and platelet count.  Patient tolerated procedure well.

## 2020-06-11 NOTE — Progress Notes (Signed)
Lindy Garczynski is a 29 y.o. G1P0 at [redacted]w[redacted]d by ultrasound admitted for induction of labor due to Hypertension.  Subjective: No c/o  Objective: BP (!) 153/94   Pulse 85   Temp 98 F (36.7 C) (Oral)   Resp 16   Ht 5\' 6"  (1.676 m)   Wt 111.5 kg   LMP 07/03/2019   BMI 39.67 kg/m  No intake/output data recorded. No intake/output data recorded.  FHT:  FHR: 135 bpm, variability: moderate,  accelerations:  Present,  decelerations:  Present early UC:   regular, every 2-3 minutes SVE:   Dilation: 6 Effacement (%): 100 Station: -1,0 Exam by:: m wilkins rnc  Labs: Lab Results  Component Value Date   WBC 13.7 (H) 06/11/2020   HGB 12.5 06/11/2020   HCT 37.8 06/11/2020   MCV 91.3 06/11/2020   PLT 314 06/11/2020    Assessment / Plan: Induction of labor due to [facility],  progressing well on pitocin  Labor: Progressing normally Preeclampsia:  n/a Fetal Wellbeing:  Category I Pain Control:  Epidural I/D:  n/a Anticipated MOD:  NSVD  Linda Hedges 06/11/2020, 5:08 PM

## 2020-06-11 NOTE — Anesthesia Preprocedure Evaluation (Signed)
Anesthesia Evaluation  Patient identified by MRN, date of birth, ID band Patient awake    Reviewed: Allergy & Precautions, H&P , NPO status , Patient's Chart, lab work & pertinent test results  Airway Mallampati: II  TM Distance: >3 FB Neck ROM: Full    Dental no notable dental hx.    Pulmonary neg pulmonary ROS,    Pulmonary exam normal breath sounds clear to auscultation       Cardiovascular hypertension, Normal cardiovascular exam Rhythm:Regular Rate:Normal     Neuro/Psych negative neurological ROS  negative psych ROS   GI/Hepatic negative GI ROS, Neg liver ROS,   Endo/Other  Hypothyroidism Morbid obesity  Renal/GU negative Renal ROS  negative genitourinary   Musculoskeletal negative musculoskeletal ROS (+)   Abdominal   Peds negative pediatric ROS (+)  Hematology negative hematology ROS (+)   Anesthesia Other Findings   Reproductive/Obstetrics negative OB ROS                             Anesthesia Physical Anesthesia Plan  ASA: III  Anesthesia Plan: General   Post-op Pain Management:    Induction:   PONV Risk Score and Plan:   Airway Management Planned:   Additional Equipment:   Intra-op Plan:   Post-operative Plan:   Informed Consent: I have reviewed the patients History and Physical, chart, labs and discussed the procedure including the risks, benefits and alternatives for the proposed anesthesia with the patient or authorized representative who has indicated his/her understanding and acceptance.       Plan Discussed with: Anesthesiologist  Anesthesia Plan Comments:         Anesthesia Quick Evaluation

## 2020-06-11 NOTE — Lactation Note (Signed)
This note was copied from a baby's chart. Lactation Consultation Note  Patient Name: Christy Rivera [id number] Date: 06/11/2020  P1, 1 hour ETI female infant. Seen in L&D, LC entered room RN change stool diaper and was weighing infant. Infant was cuing to BF, LC discussed hunger cues with parents. Mom has flat nipples, LC assisted mom in breast stimulation to evert nipple shaft out more, infant latched for 4 minutes and then stopped, infant very alert. Mom did little hand expression and infant given 2 mls on spoon. Mom was doing STS and family bonding with infant when Clarence left the room. Mom was  Happy to see that infant latched and she has colostrum in her breast. LC praised mom for her breastfeeding efforts with infant.  No charge for [facility] services in L&D.    Maternal Data    Feeding Feeding Type: Breast Fed  LATCH Score Latch: Repeated attempts needed to sustain latch, nipple held in mouth throughout feeding, stimulation needed to elicit sucking reflex.  Audible Swallowing: A few with stimulation  Type of Nipple: Everted at rest and after stimulation  Comfort (Breast/Nipple): Soft / non-tender  Hold (Positioning): Full assist, staff holds infant at breast  [facility] Score: 6  Interventions    Lactation Tools Discussed/Used     Consult Status      Vicente Serene 06/11/2020, 10:16 PM

## 2020-06-11 NOTE — Plan of Care (Signed)

## 2020-06-11 NOTE — H&P (Signed)
Christy Rivera is a 29 y.o. female G1 at [redacted]w[redacted]d presenting for IOL for CHTN; labetalol 200 BID.  Patient denies HA, vision change, RUQ pain, CP/SOB.  Patient has hypothyroidism which is stable on Synthroid 75 mcg.  Patient has mild, intermittent asthma with rare need for albuterol inhaler.  GBS positive.  Received second dose of VMP around 0500 and feels minimal cramping.    OB History    Gravida  1   Para      Term      Preterm      AB      Living        SAB      IAB      Ectopic      Multiple      Live Births             Past Medical History:  Diagnosis Date  . Asthma   . Eczema   . Essential hypertension, benign   . Hypothyroidism   . Pregnancy induced hypertension   . Urticaria    Past Surgical History:  Procedure Laterality Date  . NO PAST SURGERIES     Family History: family history includes Allergic rhinitis in her mother; Asthma in her mother; Cancer in her maternal grandfather; Diabetes in her paternal grandmother; Healthy in her brother; Heart disease in her mother and paternal uncle; Hyperlipidemia in her mother and paternal uncle; Hypertension in her mother and paternal uncle. Social History:  reports that she has never smoked. She has never used smokeless tobacco. She reports previous alcohol use. She reports that she does not use drugs.     Maternal Diabetes: No Genetic Screening: Normal Maternal Ultrasounds/Referrals: Normal Fetal Ultrasounds or other Referrals:  None Maternal Substance Abuse:  No Significant Maternal Medications:  Meds include: Syntroid Significant Maternal Lab Results:  Group B Strep positive Other Comments:  None  Review of Systems Maternal Medical History:  Contractions: Frequency: rare.   Perceived severity is mild.    Fetal activity: Perceived fetal activity is normal.   Last perceived fetal movement was within the past hour.    Prenatal complications: PIH.   Prenatal Complications - Diabetes: none.    Dilation:  2 Effacement (%): 50 Station: -2 Exam by:: Consuela Mimes, RN Blood pressure (!) 139/91, pulse 86, temperature 98 F (36.7 C), temperature source Oral, resp. rate 16, height 5\' 6"  (1.676 m), weight 111.5 kg, last menstrual period 07/03/2019. Maternal Exam:  Uterine Assessment: Contraction strength is mild.  Contraction frequency is rare.   Abdomen: Patient reports no abdominal tenderness. Fundal height is c/w dates.   Estimated fetal weight is 8#.       Fetal Exam Fetal Monitor Review: Baseline rate: 130.  Variability: moderate (6-25 bpm).   Pattern: accelerations present and no decelerations.    Fetal State Assessment: Category I - tracings are normal.     Physical Exam Constitutional:      Appearance: Normal appearance.  HENT:     Head: Normocephalic and atraumatic.  Pulmonary:     Effort: Pulmonary effort is normal.  Abdominal:     Palpations: Abdomen is soft.  Musculoskeletal:        General: Normal range of motion.     Cervical back: Normal range of motion.  Skin:    General: Skin is warm and dry.  Neurological:     General: No focal deficit present.     Mental Status: She is alert and oriented to person, place, and time.  Psychiatric:        Mood and Affect: Mood normal.        Behavior: Behavior normal.     Prenatal labs: ABO, Rh: --/--/A POS (12/22 0030) Antibody: NEG (12/22 0030) Rubella: Immune (05/25 0000) RPR: Nonreactive (05/25 0000)  HBsAg: Negative (05/25 0000)  HIV: Non-reactive (05/25 0000)  GBS: Positive/-- (12/01 0000)   Assessment/Plan: 29yo G1 at [redacted]w[redacted]d for IOL for CHTN -GBS pos-PCN for ppx -CHTN-normal to mild range BPs.  No medication started yet.  Will continue to monitor.  Labs wnl.   -Hypothyroid-Synthroid 75 mcg -CLEA when desired -Anticipate NSVD   Linda Hedges 06/11/2020, 8:24 AM

## 2020-06-12 LAB — COMPREHENSIVE METABOLIC PANEL
ALT: 15 U/L (ref 0–44)
AST: 24 U/L (ref 15–41)
Albumin: 2.2 g/dL — ABNORMAL LOW (ref 3.5–5.0)
Alkaline Phosphatase: 71 U/L (ref 38–126)
Anion gap: 9 (ref 5–15)
BUN: 10 mg/dL (ref 6–20)
CO2: 22 mmol/L (ref 22–32)
Calcium: 8.6 mg/dL — ABNORMAL LOW (ref 8.9–10.3)
Chloride: 103 mmol/L (ref 98–111)
Creatinine, Ser: 0.66 mg/dL (ref 0.44–1.00)
GFR, Estimated: >60 mL/min (ref >60)
Glucose, Bld: 107 mg/dL — ABNORMAL HIGH (ref 70–99)
Potassium: 4 mmol/L (ref 3.5–5.1)
Sodium: 134 mmol/L — ABNORMAL LOW (ref 135–145)
Total Bilirubin: 0.3 mg/dL (ref 0.3–1.2)
Total Protein: 5.3 g/dL — ABNORMAL LOW (ref 6.5–8.1)

## 2020-06-12 LAB — CBC
HCT: 31.4 % — ABNORMAL LOW (ref 36.0–46.0)
Hemoglobin: 10.5 g/dL — ABNORMAL LOW (ref 12.0–15.0)
MCH: 30.3 pg (ref 26.0–34.0)
MCHC: 33.4 g/dL (ref 30.0–36.0)
MCV: 90.5 fL (ref 80.0–100.0)
Platelets: 282 10*3/uL (ref 150–400)
RBC: 3.47 MIL/uL — ABNORMAL LOW (ref 3.87–5.11)
RDW: 13.1 % (ref 11.5–15.5)
WBC: 18.5 10*3/uL — ABNORMAL HIGH (ref 4.0–10.5)
nRBC: 0 % (ref 0.0–0.2)

## 2020-06-12 NOTE — Lactation Note (Signed)
This note was copied from a baby's chart. Lactation Consultation Note  Patient Name: Christy Rivera EVOJJ'K Date: 06/12/2020  Baby Christy Fleece now 64 hours old.  In crib on arrival.  Infant smacking his lips and sticking tongue out.  Mom reports she has been very spitty and sleepy.  Explained that it was normal but that most babies start waking up more close to the 24 hour mark.  Urged mom to feed him when cuing.  Mom reports she just put him in the crib going to give him a few. Praised breastfeeding.  Urged to call lactation as needed.   Maternal Data    Feeding Feeding Type: Breast Milk with Formula added Nipple Type: Slow - flow  LATCH Score Latch: Repeated attempts needed to sustain latch, nipple held in mouth throughout feeding, stimulation needed to elicit sucking reflex.  Audible Swallowing: None  Type of Nipple: Flat  Comfort (Breast/Nipple): Soft / non-tender  Hold (Positioning): Assistance needed to correctly position infant at breast and maintain latch.  LATCH Score: 5  Interventions Interventions: Breast feeding basics reviewed;Assisted with latch;Skin to skin;Breast massage;Hand express  Lactation Tools Discussed/Used     Consult [place] 06/12/2020, 1:32 PM

## 2020-06-12 NOTE — Progress Notes (Signed)
Post Partum Day 1 Subjective: no complaints, up ad lib, voiding, tolerating PO and + flatus  Objective: Blood pressure 116/77, pulse 90, temperature 98.4 F (36.9 C), temperature source Oral, resp. rate 18, height 5\' 6"  (1.676 m), weight 111.5 kg, last menstrual period 07/03/2019, SpO2 98 %, unknown if currently breastfeeding.  Physical Exam:  General: alert, cooperative and no distress Lochia: appropriate Uterine Fundus: firm Incision: healing well DVT Evaluation: No evidence of DVT seen on physical exam.  Recent Labs    06/11/20 2151 06/12/20 0503  HGB 12.4 10.5*  HCT 37.8 31.4*    Assessment/Plan: Plan for discharge tomorrow  Continue labetalol 200mg  BID D/W circumcision of newborn boy. Risks reviewed. No peds exam yet.   LOS: 1 day   Shon Millet II 06/12/2020, 7:26 AM

## 2020-06-12 NOTE — Anesthesia Postprocedure Evaluation (Signed)
Anesthesia Post Note  Patient: Christy Rivera  Procedure(s) Performed: AN AD [place]     Patient location during evaluation: Mother Baby Anesthesia Type: Epidural Level of consciousness: awake and alert and oriented Pain management: pain level controlled Vital Signs Assessment: post-procedure vital signs reviewed and stable Respiratory status: spontaneous breathing Cardiovascular status: stable Postop Assessment: no headache, adequate PO intake, no backache, patient able to bend at knees, able to ambulate, epidural receding and no apparent nausea or vomiting Anesthetic complications: no   No complications documented.  Last Vitals:  Vitals:   06/12/20 0648 06/12/20 0800  BP: 116/77 127/73  Pulse: 90 84  Resp: 18 20  Temp: 36.9 C 36.7 C  SpO2: 98%     Last Pain:  Vitals:   06/12/20 0800  TempSrc: Oral  PainSc: 4    Pain Goal: Patients Stated Pain Goal: 2 (06/12/20 0136)              Epidural/Spinal Function Cutaneous sensation: Normal sensation (06/12/20 0800), Patient able to flex knees: Yes (06/12/20 0800), Patient able to lift hips off bed: Yes (06/12/20 0800), Back pain beyond tenderness at insertion site: No (06/12/20 0800), Progressively worsening motor and/or sensory loss: No (06/12/20 0800), Bowel and/or bladder incontinence post epidural: No (06/12/20 0800)  Ailene Ards

## 2020-06-12 NOTE — Lactation Note (Signed)
This note was copied from a baby's chart. Lactation Consultation Note  Patient Name: Christy Rivera HENID'P Date: 06/12/2020  P1, 5 hour ETI female infant. LC entered room , mom asleep at this time and dad was doing STS with infant. Harris quietly left room.     Maternal Data    Feeding Feeding Type: Bottle Fed - Formula Nipple Type: Slow - flow  LATCH Score Latch: Repeated attempts needed to sustain latch, nipple held in mouth throughout feeding, stimulation needed to elicit sucking reflex.  Audible Swallowing: None  Type of Nipple: Everted at rest and after stimulation  Comfort (Breast/Nipple): Soft / non-tender  Hold (Positioning): Full assist, staff holds infant at breast  LATCH Score: 5  Interventions Interventions: Breast feeding basics reviewed;Assisted with latch;Hand express;Pre-pump if needed;Reverse pressure;Adjust position;Support pillows  Lactation Tools Discussed/Used     Consult Status      Vicente Serene 06/12/2020, 2:04 AM

## 2020-06-13 NOTE — Discharge Summary (Signed)
Postpartum Discharge Summary       Patient Name: Christy Rivera DOB: Oct 21, 1990 MRN: [id number]  Date of admission: 06/11/2020 Delivery date:06/11/2020  Delivering provider: Linda Hedges  Date of discharge: 06/13/2020  Admitting diagnosis: Chronic hypertension [I10] Intrauterine pregnancy: [redacted]w[redacted]d     Secondary diagnosis:  Active Problems:   Chronic hypertension  Additional problems:      Discharge diagnosis: Term Pregnancy Delivered                                              Post partum procedures:  Augmentation: AROM and Pitocin Complications: None  Hospital course: Induction of Labor With Vaginal Delivery   29 y.o. yo G1P1001 at [redacted]w[redacted]d was admitted to the hospital 06/11/2020 for induction of labor.  Indication for induction: chronic HTN.  Patient had an uncomplicated labor course as follows: Membrane Rupture Time/Date: 9:12 AM ,06/11/2020   Delivery Method:Vaginal, Spontaneous  Episiotomy: None  Lacerations:  2nd degree;Periurethral  Details of delivery can be found in separate delivery note.  Patient had a routine postpartum course. Patient is discharged home 06/13/20.  Newborn Data: Birth date:06/11/2020  Birth time:8:48 PM  Gender:Female  Living status:Living  Apgars:9 ,9  Weight:3595 g   Magnesium Sulfate received: No BMZ received: No Rhophylac:N/A MMR:N/A T-DaP:Given prenatally Flu: Yes Transfusion:No  Physical exam  Vitals:   06/12/20 1021 06/12/20 1429 06/12/20 2223 06/13/20 0624  BP: 135/83 134/87 127/85 122/86  Pulse: 84 79 85 82  Resp:  $Remo'19 17 16  'PRDMB$ Temp:  98 F (36.7 C) 98.2 F (36.8 C) 97.8 F (36.6 C)  TempSrc:  Oral Oral Oral  SpO2:   98% 96%  Weight:      Height:       General: alert, cooperative and no distress Lochia: appropriate Uterine Fundus: firm Incision: N/A DVT Evaluation: No evidence of DVT seen on physical exam. Labs: Lab Results  Component Value Date   WBC 18.5 (H) 06/12/2020   HGB 10.5 (L) 06/12/2020   HCT 31.4 (L)  06/12/2020   MCV 90.5 06/12/2020   PLT 282 06/12/2020   CMP Latest Ref Rng & Units 06/12/2020  Glucose 70 - 99 mg/dL 107(H)  BUN 6 - 20 mg/dL 10  Creatinine 0.44 - 1.00 mg/dL 0.66  Sodium 135 - 145 mmol/L 134(L)  Potassium 3.5 - 5.1 mmol/L 4.0  Chloride 98 - 111 mmol/L 103  CO2 22 - 32 mmol/L 22  Calcium 8.9 - 10.3 mg/dL 8.6(L)  Total Protein 6.5 - 8.1 g/dL 5.3(L)  Total Bilirubin 0.3 - 1.2 mg/dL 0.3  Alkaline Phos 38 - 126 U/L 71  AST 15 - 41 U/L 24  ALT 0 - 44 U/L 15   Edinburgh Score: Edinburgh Postnatal Depression Scale Screening Tool 06/12/2020  I have been able to laugh and see the funny side of things. (No Data)      After visit meds:  Allergies as of 06/13/2020   No Known Allergies     Medication List    TAKE these medications   albuterol 108 (90 Base) MCG/ACT inhaler Commonly known as: VENTOLIN HFA Inhale 2 puffs into the lungs every 6 (six) hours as needed for wheezing or shortness of breath.   EPINEPHrine 0.3 mg/0.3 mL Soaj injection Commonly known as: Auvi-Q Inject 0.3 mLs (0.3 mg total) into the muscle as needed for anaphylaxis.   fluticasone 50  MCG/ACT nasal spray Commonly known as: FLONASE Place 1 spray into both nostrils daily as needed for allergies or rhinitis.   labetalol 200 MG tablet Commonly known as: NORMODYNE Take 200 mg by mouth 2 (two) times daily.   levocetirizine 5 MG tablet Commonly known as: XYZAL Take 5 mg by mouth every evening.   levothyroxine 75 MCG tablet Commonly known as: SYNTHROID Take 1 tablet (75 mcg total) by mouth daily before breakfast.   PreNatal DHA 200 MG Caps Generic drug: Docosahexaenoic Acid Prenatal + DHA        Discharge home in stable condition Infant Feeding: Breast Infant Disposition:home with mother Discharge instruction: per After Visit Summary and Postpartum booklet. Activity: Advance as tolerated. Pelvic rest for 6 weeks.  Diet: routine diet Anticipated Birth Control: Unsure Postpartum  Appointment:6 weeks Additional Postpartum F/U:   Future Appointments:No future appointments. Follow up Visit:      06/13/2020 Luz Lex, MD

## 2020-08-05 LAB — HM PAP SMEAR

## 2020-08-26 ENCOUNTER — Other Ambulatory Visit: Payer: Self-pay

## 2020-08-26 ENCOUNTER — Encounter: Payer: Self-pay | Admitting: Family Medicine

## 2020-08-26 ENCOUNTER — Ambulatory Visit: Payer: 59 | Admitting: Family Medicine

## 2020-08-26 VITALS — BP 136/89 | HR 77 | Temp 97.6°F | Ht 66.0 in | Wt 215.0 lb

## 2020-08-26 DIAGNOSIS — I10 Essential (primary) hypertension: Secondary | ICD-10-CM | POA: Diagnosis not present

## 2020-08-26 DIAGNOSIS — E039 Hypothyroidism, unspecified: Secondary | ICD-10-CM | POA: Diagnosis not present

## 2020-08-26 DIAGNOSIS — J310 Chronic rhinitis: Secondary | ICD-10-CM | POA: Diagnosis not present

## 2020-08-26 MED ORDER — LEVOTHYROXINE SODIUM 75 MCG PO TABS: 75.0000 ug | ORAL_TABLET | Freq: Every day | ORAL | 2 refills | Status: DC

## 2020-08-26 MED ORDER — LABETALOL HCL 100 MG PO TABS: 100.0000 mg | ORAL_TABLET | Freq: Two times a day (BID) | ORAL | 3 refills | Status: DC

## 2020-08-26 NOTE — Patient Instructions (Signed)
Health Maintenance, Female Adopting a healthy lifestyle and getting preventive care are important in promoting health and wellness. Ask your health care provider about:  The right schedule for you to have regular tests and exams.  Things you can do on your own to prevent diseases and keep yourself healthy. What should I know about diet, weight, and exercise? Eat a healthy diet  Eat a diet that includes plenty of vegetables, fruits, low-fat dairy products, and lean protein.  Do not eat a lot of foods that are high in solid fats, added sugars, or sodium.   Maintain a healthy weight Body mass index (BMI) is used to identify weight problems. It estimates body fat based on height and weight. Your health care provider can help determine your BMI and help you achieve or maintain a healthy weight. Get regular exercise Get regular exercise. This is one of the most important things you can do for your health. Most adults should:  Exercise for at least 150 minutes each week. The exercise should increase your heart rate and make you sweat (moderate-intensity exercise).  Do strengthening exercises at least twice a week. This is in addition to the moderate-intensity exercise.  Spend less time sitting. Even light physical activity can be beneficial. Watch cholesterol and blood lipids Have your blood tested for lipids and cholesterol at 30 years of age, then have this test every 5 years. Have your cholesterol levels checked more often if:  Your lipid or cholesterol levels are high.  You are older than 30 years of age.  You are at high risk for heart disease. What should I know about cancer screening? Depending on your health history and family history, you may need to have cancer screening at various ages. This may include screening for:  Breast cancer.  Cervical cancer.  Colorectal cancer.  Skin cancer.  Lung cancer. What should I know about heart disease, diabetes, and high blood  pressure? Blood pressure and heart disease  High blood pressure causes heart disease and increases the risk of stroke. This is more likely to develop in people who have high blood pressure readings, are of African descent, or are overweight.  Have your blood pressure checked: ? Every 3-5 years if you are 18-39 years of age. ? Every year if you are 40 years old or older. Diabetes Have regular diabetes screenings. This checks your fasting blood sugar level. Have the screening done:  Once every three years after age 40 if you are at a normal weight and have a low risk for diabetes.  More often and at a younger age if you are overweight or have a high risk for diabetes. What should I know about preventing infection? Hepatitis B If you have a higher risk for hepatitis B, you should be screened for this virus. Talk with your health care provider to find out if you are at risk for hepatitis B infection. Hepatitis C Testing is recommended for:  Everyone born from 1945 through 1965.  Anyone with known risk factors for hepatitis C. Sexually transmitted infections (STIs)  Get screened for STIs, including gonorrhea and chlamydia, if: ? You are sexually active and are younger than 30 years of age. ? You are older than 30 years of age and your health care provider tells you that you are at risk for this type of infection. ? Your sexual activity has changed since you were last screened, and you are at increased risk for chlamydia or gonorrhea. Ask your health care provider   if you are at risk.  Ask your health care provider about whether you are at high risk for HIV. Your health care provider may recommend a prescription medicine to help prevent HIV infection. If you choose to take medicine to prevent HIV, you should first get tested for HIV. You should then be tested every 3 months for as long as you are taking the medicine. Pregnancy  If you are about to stop having your period (premenopausal) and  you may become pregnant, seek counseling before you get pregnant.  Take 400 to 800 micrograms (mcg) of folic acid every day if you become pregnant.  Ask for birth control (contraception) if you want to prevent pregnancy. Osteoporosis and menopause Osteoporosis is a disease in which the bones lose minerals and strength with aging. This can result in bone fractures. If you are 65 years old or older, or if you are at risk for osteoporosis and fractures, ask your health care provider if you should:  Be screened for bone loss.  Take a calcium or vitamin D supplement to lower your risk of fractures.  Be given hormone replacement therapy (HRT) to treat symptoms of menopause. Follow these instructions at home: Lifestyle  Do not use any products that contain nicotine or tobacco, such as cigarettes, e-cigarettes, and chewing tobacco. If you need help quitting, ask your health care provider.  Do not use street drugs.  Do not share needles.  Ask your health care provider for help if you need support or information about quitting drugs. Alcohol use  Do not drink alcohol if: ? Your health care provider tells you not to drink. ? You are pregnant, may be pregnant, or are planning to become pregnant.  If you drink alcohol: ? Limit how much you use to 0-1 drink a day. ? Limit intake if you are breastfeeding.  Be aware of how much alcohol is in your drink. In the [place] one drink equals one 12 oz bottle of beer (355 mL), one 5 oz glass of wine (148 mL), or one 1 oz glass of hard liquor (44 mL). General instructions  Schedule regular health, dental, and eye exams.  Stay current with your vaccines.  Tell your health care provider if: ? You often feel depressed. ? You have ever been abused or do not feel safe at home. Summary  Adopting a healthy lifestyle and getting preventive care are important in promoting health and wellness.  Follow your health care provider's instructions about healthy  diet, exercising, and getting tested or screened for diseases.  Follow your health care provider's instructions on monitoring your cholesterol and blood pressure. This information is not intended to replace advice given to you by your health care provider. Make sure you discuss any questions you have with your health care provider. Document Revised: 05/31/2018 Document Reviewed: 05/31/2018 Elsevier Patient Education  2021 [employer]  

## 2020-08-26 NOTE — Progress Notes (Signed)
3/8/20229:28 AM  Marcelino Freestone 19-Jan-1991, 30 y.o., female [id number]  Chief Complaint  Patient presents with  . Hypothyroidism    Medication refills   . Hypertension    HPI:   Patient is a 30 y.o. female with past medical history significant for HTN, hypothyroid who presents today for medication refills.  Recently had a baby 06/11/20 This is her first kid Had covid 3 weeks ago Still has a lingering dry cough   Hypothyroid Levothyroxine 75mg No dosage changes needed during pregnancy Lab Results  Component Value Date   TSH 2.660 01/24/2020    HTN Labetalol 200 mg bid Has only been taking once a day Had lost 70 pounds Had been off medications for a year Had to restart at 30 weeks Has a cuff at home Goal BP < 130/80  BP Readings from Last 3 Encounters:  08/26/20 136/89  06/13/20 122/86  07/27/19 132/84   Allergies/Asthma Albuterol inhaler (has not used in a long time) Flonase nasal spray Xyzal daily Epi pen  [facility] 08/26/2020 07/27/2019 07/06/2018  Decreased Interest 0 0 0  Down, Depressed, Hopeless 0 0 0  PHQ - 2 Score 0 0 0    Fall Risk  08/26/2020 07/27/2019 07/06/2018 04/18/2018 04/10/2018  Falls in the past year? 0 0 0 No No  Number falls in past yr: 0 0 - - -  Injury with Fall? 0 0 - - -  Follow up Falls evaluation completed - - - -     No Known Allergies  Prior to Admission medications   Medication Sig Start Date End Date Taking? Authorizing Provider  albuterol (VENTOLIN HFA) 108 (90 Base) MCG/ACT inhaler Inhale 2 puffs into the lungs every 6 (six) hours as needed for wheezing or shortness of breath. 12/29/18  Yes SJacelyn Pi ILilia Argue MD  Docosahexaenoic Acid (PRENATAL DHA) 200 MG CAPS Prenatal + DHA   Yes [provider]  fluticasone (FLONASE) 50 MCG/ACT nasal spray Place 1 spray into both nostrils daily as needed for allergies or rhinitis.   Yes [provider]  labetalol (NORMODYNE) 200 MG tablet Take  200 mg by mouth 2 (two) times daily.   Yes [provider]  levothyroxine (SYNTHROID) 75 MCG tablet Take 1 tablet (75 mcg total) by mouth daily before breakfast. 04/22/20  Yes MMaximiano Coss NP  EPINEPHrine (AUVI-Q) 0.3 mg/0.3 mL IJ SOAJ injection Inject 0.3 mLs (0.3 mg total) into the muscle as needed for anaphylaxis. Patient not taking: Reported on 08/26/2020 03/05/19   Bobbitt, RSedalia Muta MD  levocetirizine (XYZAL) 5 MG tablet Take 5 mg by mouth every evening. Patient not taking: Reported on 08/26/2020    [provider]    Past Medical History:  Diagnosis Date  . Asthma   . Eczema   . Essential hypertension, benign   . Hypothyroidism   . Pregnancy induced hypertension   . Urticaria     Past Surgical History:  Procedure Laterality Date  . NO PAST SURGERIES      Social History   Tobacco Use  . Smoking status: Never Smoker  . Smokeless tobacco: Never Used  Substance Use Topics  . Alcohol use: Not Currently    Family History  Problem Relation Age of Onset  . Heart disease Mother   . Hyperlipidemia Mother   . Hypertension Mother   . Allergic rhinitis Mother   . Asthma Mother   . Healthy Brother   . Heart disease Paternal Uncle   .  Hyperlipidemia Paternal Uncle   . Hypertension Paternal Uncle   . Cancer Maternal Grandfather   . Diabetes Paternal Grandmother     Review of Systems  Constitutional: Negative for chills, fever and malaise/fatigue.  Eyes: Negative for blurred vision and double vision.  Respiratory: Negative for cough, shortness of breath and wheezing.   Cardiovascular: Negative for chest pain, palpitations and leg swelling.  Gastrointestinal: Negative for abdominal pain, blood in stool, constipation, diarrhea, heartburn, nausea and vomiting.  Genitourinary: Negative for dysuria, frequency and hematuria.  Musculoskeletal: Negative for back pain and joint pain.  Skin: Negative for rash.  Neurological: Negative for dizziness, weakness and  headaches.     OBJECTIVE:  Today's Vitals   08/26/20 0852  BP: 136/89  Pulse: 77  Temp: 97.6 F (36.4 C)  SpO2: 96%  Weight: 215 lb (97.5 kg)  Height: 5' 6" (1.676 m)   Body mass index is 34.7 kg/m.   Physical Exam Constitutional:      General: She is not in acute distress.    Appearance: Normal appearance. She is not ill-appearing.  HENT:     Head: Normocephalic.  Cardiovascular:     Rate and Rhythm: Normal rate and regular rhythm.     Pulses: Normal pulses.     Heart sounds: Normal heart sounds. No murmur heard. No friction rub. No gallop.   Pulmonary:     Effort: Pulmonary effort is normal. No respiratory distress.     Breath sounds: Normal breath sounds. No stridor. No wheezing, rhonchi or rales.  Abdominal:     General: Bowel sounds are normal.     Palpations: Abdomen is soft.     Tenderness: There is no abdominal tenderness.  Musculoskeletal:     Right lower leg: No edema.     Left lower leg: No edema.  Skin:    General: Skin is warm and dry.  Neurological:     Mental Status: She is alert and oriented to person, place, and time.  Psychiatric:        Mood and Affect: Mood normal.        Behavior: Behavior normal.     No results found for this or any previous visit (from the past 24 hour(s)).  No results found.   ASSESSMENT and PLAN  Problem List Items Addressed This Visit      Cardiovascular and Mediastinum   Essential hypertension   Relevant Medications   labetalol (NORMODYNE) 100 MG tablet   Other Relevant Orders   CMP14+EGFR   CBC     Respiratory   Chronic rhinitis     Endocrine   Hypothyroidism - Primary   Relevant Medications   labetalol (NORMODYNE) 100 MG tablet   levothyroxine (SYNTHROID) 75 MCG tablet   Other Relevant Orders   TSH      Plan . Will follow up with lab results . Decreased Labetalol from 200 bid to 100 bid . Continue to monitor home BP for goal< 130/80   Return in about 6 months (around  02/26/2021).    Huston Foley Just, FNP-BC [facility] [place], [place] [address] Ph.  [phone] Fax [phone]

## 2020-08-27 LAB — CMP14+EGFR
ALT: 13 IU/L (ref 0–32)
AST: 15 IU/L (ref 0–40)
Albumin/Globulin Ratio: 1.8 (ref 1.2–2.2)
Albumin: 4.6 g/dL (ref 3.9–5.0)
Alkaline Phosphatase: 73 IU/L (ref 44–121)
BUN/Creatinine Ratio: 16 (ref 9–23)
BUN: 12 mg/dL (ref 6–20)
Bilirubin Total: 0.3 mg/dL (ref 0.0–1.2)
CO2: 20 mmol/L (ref 20–29)
Calcium: 9.3 mg/dL (ref 8.7–10.2)
Chloride: 101 mmol/L (ref 96–106)
Creatinine, Ser: 0.73 mg/dL (ref 0.57–1.00)
Globulin, Total: 2.5 g/dL (ref 1.5–4.5)
Glucose: 94 mg/dL (ref 65–99)
Potassium: 4.4 mmol/L (ref 3.5–5.2)
Sodium: 136 mmol/L (ref 134–144)
Total Protein: 7.1 g/dL (ref 6.0–8.5)
eGFR: 113 mL/min/{1.73_m2} (ref >59)

## 2020-08-27 LAB — CBC
Hematocrit: 41.6 % (ref 34.0–46.6)
Hemoglobin: 13.7 g/dL (ref 11.1–15.9)
MCH: 29.2 pg (ref 26.6–33.0)
MCHC: 32.9 g/dL (ref 31.5–35.7)
MCV: 89 fL (ref 79–97)
Platelets: 336 10*3/uL (ref 150–450)
RBC: 4.69 x10E6/uL (ref 3.77–5.28)
RDW: 12.8 % (ref 11.7–15.4)
WBC: 9.2 10*3/uL (ref 3.4–10.8)

## 2020-08-27 LAB — TSH: TSH: 1.17 u[IU]/mL (ref 0.450–4.500)

## 2020-12-02 DIAGNOSIS — L814 Other melanin hyperpigmentation: Secondary | ICD-10-CM | POA: Diagnosis not present

## 2020-12-02 DIAGNOSIS — D485 Neoplasm of uncertain behavior of skin: Secondary | ICD-10-CM | POA: Diagnosis not present

## 2020-12-02 DIAGNOSIS — D1801 Hemangioma of skin and subcutaneous tissue: Secondary | ICD-10-CM | POA: Diagnosis not present

## 2020-12-02 DIAGNOSIS — L910 Hypertrophic scar: Secondary | ICD-10-CM | POA: Diagnosis not present

## 2020-12-02 DIAGNOSIS — D2272 Melanocytic nevi of left lower limb, including hip: Secondary | ICD-10-CM | POA: Diagnosis not present

## 2020-12-02 DIAGNOSIS — D225 Melanocytic nevi of trunk: Secondary | ICD-10-CM | POA: Diagnosis not present

## 2020-12-08 ENCOUNTER — Ambulatory Visit (HOSPITAL_BASED_OUTPATIENT_CLINIC_OR_DEPARTMENT_OTHER): Payer: 59 | Admitting: Nurse Practitioner

## 2020-12-15 ENCOUNTER — Encounter (HOSPITAL_BASED_OUTPATIENT_CLINIC_OR_DEPARTMENT_OTHER): Payer: Self-pay | Admitting: Nurse Practitioner

## 2020-12-15 ENCOUNTER — Ambulatory Visit (INDEPENDENT_AMBULATORY_CARE_PROVIDER_SITE_OTHER): Payer: BC Managed Care – PPO | Admitting: Nurse Practitioner

## 2020-12-15 ENCOUNTER — Other Ambulatory Visit: Payer: Self-pay

## 2020-12-15 VITALS — BP 142/91 | HR 89 | Ht 66.0 in | Wt 223.2 lb

## 2020-12-15 DIAGNOSIS — Z7689 Persons encountering health services in other specified circumstances: Secondary | ICD-10-CM

## 2020-12-15 DIAGNOSIS — E039 Hypothyroidism, unspecified: Secondary | ICD-10-CM | POA: Diagnosis not present

## 2020-12-15 DIAGNOSIS — Z0000 Encounter for general adult medical examination without abnormal findings: Secondary | ICD-10-CM | POA: Insufficient documentation

## 2020-12-15 DIAGNOSIS — R030 Elevated blood-pressure reading, without diagnosis of hypertension: Secondary | ICD-10-CM

## 2020-12-15 MED ORDER — LEVOTHYROXINE SODIUM 50 MCG PO TABS: 50.0000 ug | ORAL_TABLET | Freq: Every day | ORAL | 6 refills | Status: DC

## 2020-12-15 NOTE — Assessment & Plan Note (Signed)
BP elevated in the office today- home readings in normal range. Consider [place] HTN, Recommend: Monitor BP at home while at rest 3-4 times per week, if BP is consistently above 135/85, let me know- we may need to consider adjustment. Her recent labs (March 2022) were normal. Continue to work on diet and exercise F/U if any sx develop or any concerns

## 2020-12-15 NOTE — Assessment & Plan Note (Signed)
Review of current and past medical history, social history, medication, and family history.  Review of care gaps and health maintenance recommendations.  Records from recent providers to be requested if not available in Chart Review or Care Everywhere.  Recommendations for health maintenance, diet, and exercise provided.  CPE today No labs today- will plan to get TSH in 6-8 weeks. CMP and CBC done in March. Return in 1 year for CPE

## 2020-12-15 NOTE — Patient Instructions (Signed)
Recommendations from today's visit: We will plan to have you come back in 6-8 weeks for lab check on your thyroid.  If at any time you are having symptoms or not feeling well, please let me know.  Once we get the labs back, we will know whether we need to change the doses.   Information on diet, exercise, and health maintenance recommendations are listed below. This is information to help you be sure you are on track for optimal health and monitoring.   Please look over this and let us know if you have any questions or if you have completed any of the health maintenance outside of [facility] so that we can be sure your records are up to date.  ___________________________________________________________  Thank you for choosing [facility] for your Primary Care needs. I am excited for the opportunity to partner with you to meet your health care goals. It was a pleasure meeting you today!  I am an Adult-Geriatric Nurse Practitioner with a background in caring for patients for more than 20 years. I received my Paediatric nurse in Nursing and my Doctor of Nursing Practice degrees at [employer]. I received additional fellowship training in primary care and sports medicine after receiving my doctorate degree. I provide primary care and sports medicine services to patients age 43 and older within this office. I am also a provider with the [facility] and the director of the APP Fellowship with [facility].  I am a [place] native, but have called the [place] area home for nearly 20 years and am proud to be a member of this community.   I am passionate about providing the best service to you through preventive medicine and supportive care. I consider you a part of the medical team and value your input. I work diligently to ensure that you are heard and your needs are met in a safe and effective manner. I want you to feel  comfortable with me as your provider and want you to know that your health concerns are important to me.   For your information, our office hours are Monday- Friday 8:00 AM - 5:00 PM At this time I am not in the office on Wednesdays.  If you have questions or concerns, please call our office at [phone] or send [place] a MyChart message and we will respond as quickly as possible.   For all urgent or time sensitive needs we ask that you please call the office to avoid delays. MyChart is not constantly monitored and replies may take up to 72 business hours.  MyChart Policy: MyChart allows for you to see your visit notes, after visit summary, provider recommendations, lab and tests results, make an appointment, request refills, and contact your provider or the office for non-urgent questions or concerns.  Providers are seeing patients during normal business hours and do not have built in time to review MyChart messages. We ask that you allow a minimum of 72 business hours for MyChart message responses.  Complex MyChart concerns may require a visit. Your provider may request you schedule a virtual or in person visit to ensure we are providing the best care possible. MyChart messages sent after 4:00 PM on Friday will not be received by the provider until Monday morning.    Lab and Test Results: You will receive your lab and test results on MyChart as soon as they are completed and results have been sent  by the lab or testing facility. Due to this service, you will receive your results BEFORE your provider.  Please allow a minimum of 72 business hours for your provider to receive and review lab and test results and contact you about.   Most lab and test result comments from the provider will be sent through [place]. Your provider may recommend changes to the plan of care, follow-up visits, repeat testing, ask questions, or request an office visit to discuss these results. You may reply directly to this  message or call the office at [phone] to provide information for the provider or set up an appointment. In some instances, you will be called with test results and recommendations. Please let us know if this is preferred and we will make note of this in your chart to provide this for you.    If you have not heard a response to your lab or test results in 72 business hours, please call the office to let us know.   After Hours: For all non-emergency after hours needs, please call the office at [phone] and select the option to reach the on-call provider service. On-call services are shared between multiple [facility] offices and therefore it will not be possible to speak directly with your provider. On-call providers may provide medical advice and recommendations, but are unable to provide refills for maintenance medications.  For all emergency or urgent medical needs after normal business hours, we recommend that you seek care at the closest Urgent Care or Emergency Department to ensure appropriate treatment in a timely manner.  [facility] [place] has a 24 hour emergency room located on the ground floor for your convenience.    Please do not hesitate to reach out to [place] with concerns.   Thank you, again, for choosing me as your health care partner. I appreciate your trust and look forward to learning more about you.   Worthy Keeler, DNP, AGNP-c ___________________________________________________________  Health Maintenance Recommendations Screening Testing Mammogram Every 1 -2 years based on history and risk factors Starting at age 54 Pap Smear Ages 21-39 every 3 years Ages 54-65 every 5 years with HPV testing More frequent testing may be required based on results and history Colon Cancer Screening Every 1-10 years based on test performed, risk factors, and history Starting at age 60 Bone Density Screening Every 2-10 years based on history Starting at age 37  for women Recommendations for men differ based on medication usage, history, and risk factors AAA Screening One time ultrasound Men 16-82 years old who have every smoked Lung Cancer Screening Low Dose Lung CT every 12 months Age 64-80 years with a 30 pack-year smoking history who still smoke or who have quit within the last 15 years  Screening Labs Routine  Labs: Complete Blood Count (CBC), Complete Metabolic Panel (CMP), Cholesterol (Lipid Panel) Every 6-12 months based on history and medications May be recommended more frequently based on current conditions or previous results Hemoglobin A1c Lab Every 3-12 months based on history and previous results Starting at age 46 or earlier with diagnosis of diabetes, high cholesterol, BMI >26, and/or risk factors Frequent monitoring for patients with diabetes to ensure blood sugar control Thyroid Panel (TSH w/ T3 & T4) Every 6 months based on history, symptoms, and risk factors May be repeated more often if on medication HIV One time testing for all patients 29 and older May be repeated more frequently for patients with increased risk factors or exposure Hepatitis C One  time testing for all patients 18 and older May be repeated more frequently for patients with increased risk factors or exposure Gonorrhea, Chlamydia Every 12 months for all sexually active persons 13-24 years Additional monitoring may be recommended for those who are considered high risk or who have symptoms PSA Men 45-47 years old with risk factors Additional screening may be recommended from age 88-69 based on risk factors, symptoms, and history  Vaccine Recommendations Tetanus Booster All adults every 10 years Flu Vaccine All patients 6 months and older every year COVID Vaccine All patients 12 years and older Initial dosing with booster May recommend additional booster based on age and health history HPV Vaccine 2 doses all patients age 41-26 Dosing may be  considered for patients over 26 Shingles Vaccine (Shingrix) 2 doses all adults 33 years and older Pneumonia (Pneumovax 23) All adults 84 years and older May recommend earlier dosing based on health history Pneumonia (Prevnar 69) All adults 21 years and older Dosed 1 year after Pneumovax 23  Additional Screening, Testing, and Vaccinations may be recommended on an individualized basis based on family history, health history, risk factors, and/or exposure.  __________________________________________________________  Diet Recommendations for All Patients  I recommend that all patients maintain a diet low in saturated fats, carbohydrates, and cholesterol. While this can be challenging at first, it is not impossible and small changes can make big differences.  Things to try: Decreasing the amount of soda, sweet tea, and/or juice to one or less per day and replace with water While water is always the first choice, if you do not like water you may consider adding a water additive without sugar to improve the taste other sugar free drinks Replace potatoes with a brightly colored vegetable at dinner Use healthy oils, such as canola oil or olive oil, instead of butter or hard margarine Limit your bread intake to two pieces or less a day Replace regular pasta with low carb pasta options Bake, broil, or grill foods instead of frying Monitor portion sizes  Eat smaller, more frequent meals throughout the day instead of large meals  An important thing to remember is, if you love foods that are not great for your health, you don't have to give them up completely. Instead, allow these foods to be a reward when you have done well. Allowing yourself to still have special treats every once in a while is a nice way to tell yourself thank you for working hard to keep yourself healthy.   Also remember that every day is a new day. If you have a bad day and "fall off the wagon", you can still climb right back  up and keep moving along on your journey!  We have resources available to help you!  Some websites that may be helpful include: www.http://carter.biz/  Www.VeryWellFit.com _____________________________________________________________  Activity Recommendations for All Patients  I recommend that all adults get at least 20 minutes of moderate physical activity that elevates your heart rate at least 5 days out of the week.  Some examples include: Walking or jogging at a pace that allows you to carry on a conversation Cycling (stationary bike or outdoors) Water aerobics Yoga Weight lifting Dancing If physical limitations prevent you from putting stress on your joints, exercise in a pool or seated in a chair are excellent options.  Do determine your MAXIMUM heart rate for activity: YOUR AGE - 220 = MAX HeartRate   Remember! Do not push yourself too hard.  Start slowly and build  up your pace, speed, weight, time in exercise, etc.  Allow your body to rest between exercise and get good sleep. You will need more water than normal when you are exerting yourself. Do not wait until you are thirsty to drink. Drink with a purpose of getting in at least 8, 8 ounce glasses of water a day plus more depending on how much you exercise and sweat.    If you begin to develop dizziness, chest pain, abdominal pain, jaw pain, shortness of breath, headache, vision changes, lightheadedness, or other concerning symptoms, stop the activity and allow your body to rest. If your symptoms are severe, seek emergency evaluation immediately. If your symptoms are concerning, but not severe, please let us know so that we can recommend further evaluation.   ________________________________________________________________

## 2020-12-15 NOTE — Progress Notes (Signed)
Christy Keeler, DNP, AGNP-c Primary Care Services ______________________________________________________________________  HPI Christy Rivera is a 30 y.o. female presenting to [facility] at [place] today to establish care.   Patient Care Team: Demontre Padin, Coralee Pesa, NP as PCP - General (Nurse Practitioner)  Concerns Today: Hypothyroidism On levothyroxine for about 9 years- 5-6 years on 65mcg dose Last labs in March 2022 Has been out of medication for two weeks and reports feeling less anxious (previous doctors office closed) No fatigue, weakness, depression, sluggishness, palpitations, constipation, hair loss, or weight gain. Reports she thinks she feels better off of medication  HTN Hx of hypertension before pregnancy- lost 70 lbs and came off of medication While pregnant restarted labetalol during last trimester due to HTN Stop labetalol in March due to dizziness during the day Has been checking BP at home, typically 120's-130's/80's No headaches, no dizziness, no chest pain, no palpitations, no vision changes.    Health Maintenance  Topic Date Due   COVID-19 Vaccine (3 - Booster for Moderna series) 11/17/2020   Pap Smear  01/23/2021   Hepatitis C Screening: USPSTF Recommendation to screen - Ages 18-79 yo.  01/21/2021*   Flu Shot  01/19/2021   Tetanus Vaccine  03/25/2030   Pneumococcal Vaccination  Aged Out   HPV Vaccine  Aged Out   HIV Screening  Discontinued  *Topic was postponed. The date shown is not the original due date.    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    PHQ9 Today: Depression screen [facility] 2/9 12/15/2020 08/26/2020 07/27/2019  Decreased Interest 0 0 0  Down,  Depressed, Hopeless 0 0 0  PHQ - 2 Score 0 0 0  Altered sleeping 0 - -  Tired, decreased energy 0 - -  Change in appetite 0 - -  Feeling bad or failure about yourself  0 - -  Trouble concentrating 0 - -  Moving slowly or fidgety/restless 0 - -  Suicidal thoughts 0 - -  PHQ-9 Score 0 - -   GAD7 Today: GAD 7 : Generalized Anxiety Score 12/15/2020  Nervous, Anxious, on Edge 1  Control/stop worrying 0  Worry too much - different things 1  Trouble relaxing 0  Restless 0  Easily annoyed or irritable 0  Afraid - awful might happen 0  Total GAD 7 Score 2  Anxiety Difficulty Not difficult at all   ______________________________________________________________________ PMH Past Medical History:  Diagnosis Date   Allergic reaction 03/05/2019   Asthma    Chronic hypertension 06/11/2020   Eczema    Essential hypertension 12/29/2018   Essential hypertension, benign    Hypothyroidism    Pregnancy induced hypertension    Urticaria     ROS All review of systems negative except what is listed in the HPI  PHYSICAL EXAM Physical Exam Vitals and nursing note reviewed.  Constitutional:      Appearance: Normal appearance.  HENT:     Head: Normocephalic and atraumatic.     Right Ear: Tympanic membrane, ear canal and external ear normal.     Left Ear: Tympanic membrane, ear canal and external ear normal.     Nose: Nose normal.     Mouth/Throat:     Mouth: Mucous membranes are moist.     Pharynx: Oropharynx is  clear.  Eyes:     Extraocular Movements: Extraocular movements intact.     Conjunctiva/sclera: Conjunctivae normal.     Pupils: Pupils are equal, round, and reactive to light.  Neck:     Vascular: No carotid bruit.  Cardiovascular:     Rate and Rhythm: Normal rate and regular rhythm.     Pulses: Normal pulses.     Heart sounds: Normal heart sounds.  Pulmonary:     Effort: Pulmonary effort is normal.     Breath sounds: Normal breath sounds.  Abdominal:     General: Abdomen  is flat. Bowel sounds are normal. There is no distension.     Palpations: Abdomen is soft. There is no mass.     Tenderness: There is no abdominal tenderness. There is no right CVA tenderness, left CVA tenderness, guarding or rebound.  Musculoskeletal:        General: Normal range of motion.     Cervical back: Normal range of motion and neck supple. No tenderness.     Left lower leg: No edema.  Lymphadenopathy:     Cervical: No cervical adenopathy.  Skin:    General: Skin is warm and dry.     Capillary Refill: Capillary refill takes less than 2 seconds.  Neurological:     General: No focal deficit present.     Mental Status: She is alert and oriented to person, place, and time.  Psychiatric:        Mood and Affect: Mood normal.        Behavior: Behavior normal.        Thought Content: Thought content normal.        Judgment: Judgment normal.   ______________________________________________________________________ ASSESSMENT AND PLAN Problem List Items Addressed This Visit     Hypothyroidism    Will plan to restart levothyroxine at 4mcg dose- labs drawn in March show normal range, but lower end of normal from her other labs, which may have contributed to increased anxiety symptoms.  Will plan to recheck labs in 6-8 weeks to determine if 73mcg dose is appropriate. Recommend: Restart levothyroxine Notify if any symptoms of hypo or hyperthyroidism develop F/U in 6-8 weeks for labs       Relevant Medications   levothyroxine (SYNTHROID) 50 MCG tablet   Other Relevant Orders   TSH   Elevated blood-pressure reading without diagnosis of hypertension    BP elevated in the office today- home readings in normal range. Consider [place] HTN, Recommend: Monitor BP at home while at rest 3-4 times per week, if BP is consistently above 135/85, let me know- we may need to consider adjustment. Her recent labs (March 2022) were normal. Continue to work on diet and exercise F/U if any sx  develop or any concerns       Encounter to establish care - Primary    Review of current and past medical history, social history, medication, and family history.  Review of care gaps and health maintenance recommendations.  Records from recent providers to be requested if not available in Chart Review or Care Everywhere.  Recommendations for health maintenance, diet, and exercise provided.  CPE today No labs today- will plan to get TSH in 6-8 weeks. CMP and CBC done in March. Return in 1 year for CPE         Education provided today during visit and on AVS for patient to review at home.  Diet and Exercise recommendations provided.  Current diagnoses and recommendations discussed. HM  recommendations reviewed with recommendations.    Outpatient Encounter Medications as of 12/15/2020  Medication Sig   albuterol (VENTOLIN HFA) 108 (90 Base) MCG/ACT inhaler Inhale 2 puffs into the lungs every 6 (six) hours as needed for wheezing or shortness of breath.   Docosahexaenoic Acid (PRENATAL DHA) 200 MG CAPS Prenatal + DHA   levothyroxine (SYNTHROID) 50 MCG tablet Take 1 tablet (50 mcg total) by mouth daily before breakfast.   [DISCONTINUED] levothyroxine (SYNTHROID) 75 MCG tablet Take 1 tablet (75 mcg total) by mouth daily before breakfast.   [DISCONTINUED] EPINEPHrine (AUVI-Q) 0.3 mg/0.3 mL IJ SOAJ injection Inject 0.3 mLs (0.3 mg total) into the muscle as needed for anaphylaxis. (Patient not taking: No sig reported)   [DISCONTINUED] fluticasone (FLONASE) 50 MCG/ACT nasal spray Place 1 spray into both nostrils daily as needed for allergies or rhinitis.   [DISCONTINUED] labetalol (NORMODYNE) 100 MG tablet Take 1 tablet (100 mg total) by mouth 2 (two) times daily.   [DISCONTINUED] levocetirizine (XYZAL) 5 MG tablet Take 5 mg by mouth every evening. (Patient not taking: Reported on 08/26/2020)   No facility-administered encounter medications on file as of 12/15/2020.    Return in about 8 weeks  (around 02/09/2021) for TSH levels- CPE today.  Time: 55 minutes, >50% spent counseling, care coordination, chart review, and documentation.   Orma Render, DNP, AGNP-c

## 2020-12-15 NOTE — Assessment & Plan Note (Signed)
Will plan to restart levothyroxine at 29mcg dose- labs drawn in March show normal range, but lower end of normal from her other labs, which may have contributed to increased anxiety symptoms.  Will plan to recheck labs in 6-8 weeks to determine if 60mcg dose is appropriate. Recommend: Restart levothyroxine Notify if any symptoms of hypo or hyperthyroidism develop F/U in 6-8 weeks for labs

## 2021-01-26 ENCOUNTER — Encounter (HOSPITAL_BASED_OUTPATIENT_CLINIC_OR_DEPARTMENT_OTHER): Payer: Self-pay | Admitting: Nurse Practitioner

## 2021-01-26 DIAGNOSIS — E039 Hypothyroidism, unspecified: Secondary | ICD-10-CM

## 2021-02-05 MED ORDER — LEVOTHYROXINE SODIUM 50 MCG PO TABS: 75.0000 ug | ORAL_TABLET | Freq: Every day | ORAL | 6 refills | Status: DC

## 2021-02-09 ENCOUNTER — Ambulatory Visit (HOSPITAL_BASED_OUTPATIENT_CLINIC_OR_DEPARTMENT_OTHER): Payer: BC Managed Care – PPO

## 2021-05-27 DIAGNOSIS — Z23 Encounter for immunization: Secondary | ICD-10-CM | POA: Diagnosis not present

## 2021-05-27 DIAGNOSIS — Z0130 Encounter for examination of blood pressure without abnormal findings: Secondary | ICD-10-CM | POA: Diagnosis not present

## 2021-05-27 DIAGNOSIS — Z713 Dietary counseling and surveillance: Secondary | ICD-10-CM | POA: Diagnosis not present

## 2021-05-27 DIAGNOSIS — Z131 Encounter for screening for diabetes mellitus: Secondary | ICD-10-CM | POA: Diagnosis not present

## 2021-05-27 DIAGNOSIS — Z136 Encounter for screening for cardiovascular disorders: Secondary | ICD-10-CM | POA: Diagnosis not present

## 2021-05-27 DIAGNOSIS — Z13220 Encounter for screening for lipoid disorders: Secondary | ICD-10-CM | POA: Diagnosis not present

## 2021-08-03 ENCOUNTER — Other Ambulatory Visit: Payer: Self-pay

## 2021-08-03 ENCOUNTER — Ambulatory Visit (INDEPENDENT_AMBULATORY_CARE_PROVIDER_SITE_OTHER): Payer: BC Managed Care – PPO | Admitting: Nurse Practitioner

## 2021-08-03 ENCOUNTER — Encounter (HOSPITAL_BASED_OUTPATIENT_CLINIC_OR_DEPARTMENT_OTHER): Payer: Self-pay | Admitting: Nurse Practitioner

## 2021-08-03 VITALS — BP 122/82 | HR 88 | Ht 66.0 in | Wt 227.3 lb

## 2021-08-03 DIAGNOSIS — E039 Hypothyroidism, unspecified: Secondary | ICD-10-CM

## 2021-08-03 MED ORDER — LEVOTHYROXINE SODIUM 75 MCG PO TABS: 75.0000 ug | ORAL_TABLET | Freq: Every day | ORAL | 3 refills | Status: DC

## 2021-08-03 NOTE — Progress Notes (Signed)
Established Patient Office Visit  Subjective:  Patient ID: Christy Rivera, female    DOB: 04-18-1991  Age: 31 y.o. MRN: [id number]  CC:  Chief Complaint  Patient presents with   Follow-up    Patient presents today for follow up of hypertyroid. She is feeling great. She would like to change medication to 22m instead of taken 1.5 pills. She would like labs done today as well.    HPI Christy Rivera for follow-up of hypothyroidism. She reports she is feeling good on the medication and feels like the dosage is working well for her. She is due for labs today. She denies concerns with increased fatigue, depressive symptoms, anxiety.   Past Medical History:  Diagnosis Date   Allergic reaction 03/05/2019   Asthma    Chronic hypertension 06/11/2020   Eczema    Essential hypertension 12/29/2018   Essential hypertension, benign    Hypothyroidism    Pregnancy induced hypertension    Urticaria     Past Surgical History:  Procedure Laterality Date   NO PAST SURGERIES      Family History  Problem Relation Age of Onset   Heart disease Mother    Hyperlipidemia Mother    Hypertension Mother    Allergic rhinitis Mother    Asthma Mother    Healthy Brother    Heart disease Paternal Uncle    Hyperlipidemia Paternal Uncle    Hypertension Paternal Uncle    Cancer Maternal Grandfather    Diabetes Paternal Grandmother     Social History   Socioeconomic History   Marital status: Married    Spouse name: Thad    Number of children: 1   Years of education: Not on file   Highest education level: Bachelor's degree (e.g., BA, AB, BS)  Occupational History   Not on file  Tobacco Use   Smoking status: Never   Smokeless tobacco: Never  Vaping Use   Vaping Use: Never used  Substance and Sexual Activity   Alcohol use: Not Currently    Alcohol/week: 1.0 standard drink    Types: 1 Glasses of wine per week   Drug use: No   Sexual activity: Yes  Other Topics Concern   Not  on file  Social History Narrative   Not on file   Social Determinants of Health   Financial Resource Strain: Not on file  Food Insecurity: Not on file  Transportation Needs: Not on file  Physical Activity: Not on file  Stress: Not on file  Social Connections: Not on file  Intimate Partner Violence: Not on file    Outpatient Medications Prior to Visit  Medication Sig Dispense Refill   albuterol (VENTOLIN HFA) 108 (90 Base) MCG/ACT inhaler Inhale 2 puffs into the lungs every 6 (six) hours as needed for wheezing or shortness of breath. 8 g 2   Docosahexaenoic Acid (PRENATAL DHA) 200 MG CAPS Prenatal + DHA     levothyroxine (SYNTHROID) 50 MCG tablet Take 1.5 tablets (75 mcg total) by mouth daily before breakfast. 30 tablet 6   No facility-administered medications prior to visit.    No Known Allergies  ROS Review of Systems All review of systems negative except what is listed in the HPI    Objective:    Physical Exam Vitals and nursing note reviewed.  Constitutional:      Appearance: Normal appearance.  HENT:     Head: Normocephalic.  Eyes:     Extraocular Movements: Extraocular movements intact.  Conjunctiva/sclera: Conjunctivae normal.     Pupils: Pupils are equal, round, and reactive to light.  Neck:     Vascular: No carotid bruit.  Cardiovascular:     Rate and Rhythm: Normal rate and regular rhythm.     Pulses: Normal pulses.     Heart sounds: Normal heart sounds.  Pulmonary:     Effort: Pulmonary effort is normal.     Breath sounds: Normal breath sounds.  Musculoskeletal:     Cervical back: Normal range of motion.     Right lower leg: No edema.     Left lower leg: No edema.  Skin:    General: Skin is warm and dry.     Capillary Refill: Capillary refill takes less than 2 seconds.  Neurological:     General: No focal deficit present.     Mental Status: She is alert and oriented to person, place, and time.  Psychiatric:        Mood and Affect: Mood  normal.        Behavior: Behavior normal.        Thought Content: Thought content normal.        Judgment: Judgment normal.    BP 122/82    Pulse 88    Ht _0  (1.676 m)    Wt 227 lb 4.8 oz (103.1 kg)    SpO2 99%    BMI 36.69 kg/m  Wt Readings from Last 3 Encounters:  08/03/21 227 lb 4.8 oz (103.1 kg)  12/15/20 223 lb 3.2 oz (101.2 kg)  08/26/20 215 lb (97.5 kg)     Health Maintenance Due  Topic Date Due   Hepatitis C Screening  Never done   COVID-19 Vaccine (3 - Booster for Moderna series) 08/14/2020   PAP SMEAR-Modifier  01/23/2021    There are no preventive care reminders to display for this patient.  Lab Results  Component Value Date   TSH 1.610 08/03/2021   Lab Results  Component Value Date   WBC 9.0 08/03/2021   HGB 14.3 08/03/2021   HCT 41.5 08/03/2021   MCV 90 08/03/2021   PLT 304 08/03/2021   Lab Results  Component Value Date   NA 140 08/03/2021   K 4.4 08/03/2021   CO2 23 08/03/2021   GLUCOSE 94 08/03/2021   BUN 13 08/03/2021   CREATININE 0.75 08/03/2021   BILITOT 0.4 08/03/2021   ALKPHOS 72 08/03/2021   AST 16 08/03/2021   ALT 14 08/03/2021   PROT 7.3 08/03/2021   ALBUMIN 4.9 08/03/2021   CALCIUM 9.3 08/03/2021   ANIONGAP 9 06/12/2020   EGFR 110 08/03/2021   Lab Results  Component Value Date   CHOL 198 12/29/2018   Lab Results  Component Value Date   HDL 59 12/29/2018   Lab Results  Component Value Date   LDLCALC 105 (H) 12/29/2018   Lab Results  Component Value Date   TRIG 169 (H) 12/29/2018   Lab Results  Component Value Date   CHOLHDL 3.4 12/29/2018   No results found for: HGBA1C    Assessment & Plan:   Problem List Items Addressed This Visit     Hypothyroidism - Primary    Chronic. Restart on medication in June of last year.  No new symptoms at this time.  Will recheck labs today for evaluation and send in 56mg dosing in single tablet for patient.  If levels stable will plan follow-up for one year or sooner if  needed.  Relevant Medications   levothyroxine (SYNTHROID) 75 MCG tablet   Other Relevant Orders   CBC with Differential/Platelet (Completed)   Comprehensive metabolic panel (Completed)   TSH (Completed)    Meds ordered this encounter  Medications   levothyroxine (SYNTHROID) 75 MCG tablet    Sig: Take 1 tablet (75 mcg total) by mouth daily.    Dispense:  90 tablet    Refill:  3    Follow-up: Return in about 1 year (around 08/03/2022) for thyroid.    Orma Render, NP

## 2021-08-03 NOTE — Patient Instructions (Addendum)
I have sent in the prescription for levothyroxine 75mg  tablets.  If there are any concerning findings on your labs I will let you know.   If the hormone changes keep up, please let me know.

## 2021-08-04 LAB — COMPREHENSIVE METABOLIC PANEL
ALT: 14 IU/L (ref 0–32)
AST: 16 IU/L (ref 0–40)
Albumin/Globulin Ratio: 2 (ref 1.2–2.2)
Albumin: 4.9 g/dL (ref 3.9–5.0)
Alkaline Phosphatase: 72 IU/L (ref 44–121)
BUN/Creatinine Ratio: 17 (ref 9–23)
BUN: 13 mg/dL (ref 6–20)
Bilirubin Total: 0.4 mg/dL (ref 0.0–1.2)
CO2: 23 mmol/L (ref 20–29)
Calcium: 9.3 mg/dL (ref 8.7–10.2)
Chloride: 102 mmol/L (ref 96–106)
Creatinine, Ser: 0.75 mg/dL (ref 0.57–1.00)
Globulin, Total: 2.4 g/dL (ref 1.5–4.5)
Glucose: 94 mg/dL (ref 70–99)
Potassium: 4.4 mmol/L (ref 3.5–5.2)
Sodium: 140 mmol/L (ref 134–144)
Total Protein: 7.3 g/dL (ref 6.0–8.5)
eGFR: 110 mL/min/{1.73_m2} (ref >59)

## 2021-08-04 LAB — CBC WITH DIFFERENTIAL/PLATELET
Basophils Absolute: 0.1 10*3/uL (ref 0.0–0.2)
Basos: 1 %
EOS (ABSOLUTE): 0.8 10*3/uL — ABNORMAL HIGH (ref 0.0–0.4)
Eos: 9 %
Hematocrit: 41.5 % (ref 34.0–46.6)
Hemoglobin: 14.3 g/dL (ref 11.1–15.9)
Immature Grans (Abs): 0 10*3/uL (ref 0.0–0.1)
Immature Granulocytes: 0 %
Lymphocytes Absolute: 2.9 10*3/uL (ref 0.7–3.1)
Lymphs: 33 %
MCH: 31.1 pg (ref 26.6–33.0)
MCHC: 34.5 g/dL (ref 31.5–35.7)
MCV: 90 fL (ref 79–97)
Monocytes Absolute: 0.6 10*3/uL (ref 0.1–0.9)
Monocytes: 6 %
Neutrophils Absolute: 4.6 10*3/uL (ref 1.4–7.0)
Neutrophils: 51 %
Platelets: 304 10*3/uL (ref 150–450)
RBC: 4.6 x10E6/uL (ref 3.77–5.28)
RDW: 12.5 % (ref 11.7–15.4)
WBC: 9 10*3/uL (ref 3.4–10.8)

## 2021-08-04 LAB — TSH: TSH: 1.61 u[IU]/mL (ref 0.450–4.500)

## 2021-08-05 NOTE — Assessment & Plan Note (Addendum)
Chronic. Restart on medication in June of last year.  No new symptoms at this time.  Will recheck labs today for evaluation and send in 45mcg dosing in single tablet for patient.  If levels stable will plan follow-up for one year or sooner if needed.

## 2021-10-07 DIAGNOSIS — Z6835 Body mass index (BMI) 35.0-35.9, adult: Secondary | ICD-10-CM | POA: Diagnosis not present

## 2021-10-07 DIAGNOSIS — Z01419 Encounter for gynecological examination (general) (routine) without abnormal findings: Secondary | ICD-10-CM | POA: Diagnosis not present

## 2021-11-23 DIAGNOSIS — M5432 Sciatica, left side: Secondary | ICD-10-CM | POA: Diagnosis not present

## 2021-11-23 DIAGNOSIS — M9903 Segmental and somatic dysfunction of lumbar region: Secondary | ICD-10-CM | POA: Diagnosis not present

## 2021-11-24 DIAGNOSIS — M9903 Segmental and somatic dysfunction of lumbar region: Secondary | ICD-10-CM | POA: Diagnosis not present

## 2021-11-24 DIAGNOSIS — M5432 Sciatica, left side: Secondary | ICD-10-CM | POA: Diagnosis not present

## 2021-11-26 DIAGNOSIS — M5432 Sciatica, left side: Secondary | ICD-10-CM | POA: Diagnosis not present

## 2021-11-26 DIAGNOSIS — M9903 Segmental and somatic dysfunction of lumbar region: Secondary | ICD-10-CM | POA: Diagnosis not present

## 2021-11-30 DIAGNOSIS — M9903 Segmental and somatic dysfunction of lumbar region: Secondary | ICD-10-CM | POA: Diagnosis not present

## 2021-11-30 DIAGNOSIS — M5432 Sciatica, left side: Secondary | ICD-10-CM | POA: Diagnosis not present

## 2021-12-02 DIAGNOSIS — M9903 Segmental and somatic dysfunction of lumbar region: Secondary | ICD-10-CM | POA: Diagnosis not present

## 2021-12-02 DIAGNOSIS — M5432 Sciatica, left side: Secondary | ICD-10-CM | POA: Diagnosis not present

## 2021-12-03 DIAGNOSIS — L821 Other seborrheic keratosis: Secondary | ICD-10-CM | POA: Diagnosis not present

## 2021-12-03 DIAGNOSIS — D2371 Other benign neoplasm of skin of right lower limb, including hip: Secondary | ICD-10-CM | POA: Diagnosis not present

## 2021-12-03 DIAGNOSIS — L814 Other melanin hyperpigmentation: Secondary | ICD-10-CM | POA: Diagnosis not present

## 2021-12-03 DIAGNOSIS — L910 Hypertrophic scar: Secondary | ICD-10-CM | POA: Diagnosis not present

## 2021-12-07 DIAGNOSIS — M5432 Sciatica, left side: Secondary | ICD-10-CM | POA: Diagnosis not present

## 2021-12-07 DIAGNOSIS — M9903 Segmental and somatic dysfunction of lumbar region: Secondary | ICD-10-CM | POA: Diagnosis not present

## 2021-12-09 DIAGNOSIS — M9903 Segmental and somatic dysfunction of lumbar region: Secondary | ICD-10-CM | POA: Diagnosis not present

## 2021-12-09 DIAGNOSIS — M5432 Sciatica, left side: Secondary | ICD-10-CM | POA: Diagnosis not present

## 2021-12-14 DIAGNOSIS — M5432 Sciatica, left side: Secondary | ICD-10-CM | POA: Diagnosis not present

## 2021-12-14 DIAGNOSIS — M9903 Segmental and somatic dysfunction of lumbar region: Secondary | ICD-10-CM | POA: Diagnosis not present

## 2021-12-17 DIAGNOSIS — M5432 Sciatica, left side: Secondary | ICD-10-CM | POA: Diagnosis not present

## 2021-12-17 DIAGNOSIS — M9903 Segmental and somatic dysfunction of lumbar region: Secondary | ICD-10-CM | POA: Diagnosis not present

## 2021-12-24 DIAGNOSIS — M9903 Segmental and somatic dysfunction of lumbar region: Secondary | ICD-10-CM | POA: Diagnosis not present

## 2021-12-24 DIAGNOSIS — M5432 Sciatica, left side: Secondary | ICD-10-CM | POA: Diagnosis not present

## 2021-12-28 DIAGNOSIS — M9903 Segmental and somatic dysfunction of lumbar region: Secondary | ICD-10-CM | POA: Diagnosis not present

## 2021-12-28 DIAGNOSIS — M5432 Sciatica, left side: Secondary | ICD-10-CM | POA: Diagnosis not present

## 2021-12-31 DIAGNOSIS — M9903 Segmental and somatic dysfunction of lumbar region: Secondary | ICD-10-CM | POA: Diagnosis not present

## 2021-12-31 DIAGNOSIS — M5432 Sciatica, left side: Secondary | ICD-10-CM | POA: Diagnosis not present

## 2022-01-25 DIAGNOSIS — M9903 Segmental and somatic dysfunction of lumbar region: Secondary | ICD-10-CM | POA: Diagnosis not present

## 2022-01-25 DIAGNOSIS — M5432 Sciatica, left side: Secondary | ICD-10-CM | POA: Diagnosis not present

## 2022-02-05 DIAGNOSIS — N911 Secondary amenorrhea: Secondary | ICD-10-CM | POA: Diagnosis not present

## 2022-02-10 ENCOUNTER — Other Ambulatory Visit: Payer: Self-pay | Admitting: Obstetrics & Gynecology

## 2022-02-10 DIAGNOSIS — Z363 Encounter for antenatal screening for malformations: Secondary | ICD-10-CM

## 2022-02-12 DIAGNOSIS — Z3685 Encounter for antenatal screening for Streptococcus B: Secondary | ICD-10-CM | POA: Diagnosis not present

## 2022-02-12 DIAGNOSIS — Z3481 Encounter for supervision of other normal pregnancy, first trimester: Secondary | ICD-10-CM | POA: Diagnosis not present

## 2022-02-12 LAB — OB RESULTS CONSOLE RUBELLA ANTIBODY, IGM: Rubella: IMMUNE

## 2022-02-12 LAB — OB RESULTS CONSOLE RPR: RPR: NONREACTIVE

## 2022-02-12 LAB — OB RESULTS CONSOLE ABO/RH: RH Type: POSITIVE

## 2022-02-12 LAB — HEPATITIS C ANTIBODY: HCV Ab: NEGATIVE

## 2022-02-12 LAB — OB RESULTS CONSOLE ANTIBODY SCREEN: Antibody Screen: NEGATIVE

## 2022-02-12 LAB — OB RESULTS CONSOLE HIV ANTIBODY (ROUTINE TESTING): HIV: NONREACTIVE

## 2022-02-12 LAB — OB RESULTS CONSOLE HEPATITIS B SURFACE ANTIGEN: Hepatitis B Surface Ag: NEGATIVE

## 2022-02-15 DIAGNOSIS — M5432 Sciatica, left side: Secondary | ICD-10-CM | POA: Diagnosis not present

## 2022-02-15 DIAGNOSIS — M9903 Segmental and somatic dysfunction of lumbar region: Secondary | ICD-10-CM | POA: Diagnosis not present

## 2022-02-26 DIAGNOSIS — Z113 Encounter for screening for infections with a predominantly sexual mode of transmission: Secondary | ICD-10-CM | POA: Diagnosis not present

## 2022-02-26 DIAGNOSIS — Z3A11 11 weeks gestation of pregnancy: Secondary | ICD-10-CM | POA: Diagnosis not present

## 2022-02-26 DIAGNOSIS — O30031 Twin pregnancy, monochorionic/diamniotic, first trimester: Secondary | ICD-10-CM | POA: Diagnosis not present

## 2022-03-01 LAB — OB RESULTS CONSOLE GC/CHLAMYDIA
Chlamydia: NEGATIVE
Neisseria Gonorrhea: NEGATIVE

## 2022-03-04 DIAGNOSIS — O30031 Twin pregnancy, monochorionic/diamniotic, first trimester: Secondary | ICD-10-CM | POA: Diagnosis not present

## 2022-03-04 DIAGNOSIS — Z3682 Encounter for antenatal screening for nuchal translucency: Secondary | ICD-10-CM | POA: Diagnosis not present

## 2022-03-04 DIAGNOSIS — Z3A12 12 weeks gestation of pregnancy: Secondary | ICD-10-CM | POA: Diagnosis not present

## 2022-03-08 ENCOUNTER — Encounter: Payer: Self-pay | Admitting: *Deleted

## 2022-03-12 ENCOUNTER — Ambulatory Visit (HOSPITAL_BASED_OUTPATIENT_CLINIC_OR_DEPARTMENT_OTHER): Payer: BC Managed Care – PPO | Admitting: Obstetrics

## 2022-03-12 ENCOUNTER — Ambulatory Visit: Payer: BC Managed Care – PPO | Attending: Obstetrics & Gynecology

## 2022-03-12 ENCOUNTER — Ambulatory Visit: Payer: BC Managed Care – PPO | Admitting: *Deleted

## 2022-03-12 ENCOUNTER — Other Ambulatory Visit: Payer: Self-pay | Admitting: *Deleted

## 2022-03-12 ENCOUNTER — Encounter: Payer: Self-pay | Admitting: *Deleted

## 2022-03-12 VITALS — BP 135/86 | HR 98

## 2022-03-12 DIAGNOSIS — O10911 Unspecified pre-existing hypertension complicating pregnancy, first trimester: Secondary | ICD-10-CM

## 2022-03-12 DIAGNOSIS — O09291 Supervision of pregnancy with other poor reproductive or obstetric history, first trimester: Secondary | ICD-10-CM

## 2022-03-12 DIAGNOSIS — O30031 Twin pregnancy, monochorionic/diamniotic, first trimester: Secondary | ICD-10-CM

## 2022-03-12 DIAGNOSIS — O283 Abnormal ultrasonic finding on antenatal screening of mother: Secondary | ICD-10-CM | POA: Diagnosis not present

## 2022-03-12 DIAGNOSIS — E079 Disorder of thyroid, unspecified: Secondary | ICD-10-CM | POA: Diagnosis not present

## 2022-03-12 DIAGNOSIS — O99281 Endocrine, nutritional and metabolic diseases complicating pregnancy, first trimester: Secondary | ICD-10-CM | POA: Diagnosis not present

## 2022-03-12 DIAGNOSIS — Z3A13 13 weeks gestation of pregnancy: Secondary | ICD-10-CM | POA: Insufficient documentation

## 2022-03-12 DIAGNOSIS — Z363 Encounter for antenatal screening for malformations: Secondary | ICD-10-CM | POA: Diagnosis not present

## 2022-03-12 DIAGNOSIS — O09211 Supervision of pregnancy with history of pre-term labor, first trimester: Secondary | ICD-10-CM

## 2022-03-12 DIAGNOSIS — O09899 Supervision of other high risk pregnancies, unspecified trimester: Secondary | ICD-10-CM

## 2022-03-12 NOTE — Progress Notes (Signed)
MFM Note  Christy Rivera  was seen due to a spontaneously conceived twin pregnancy.  She denies any significant past medical history and denies any problems in her current pregnancy.  The patient had a nuchal translucency measurement performed in your office which showed that twin A had an increased NT of 3.4 mm.  Twin B's NT was within normal limits, 1.7 mm.  She had a cell free DNA test earlier in her pregnancy which indicated a low risk for trisomy 107, 74, and 13.  Two female fetus are predicted.  These are predicted to be monozygotic twins.  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  The crown-rump lengths for both twin A and twin B were consistent with an [facility] of September 14, 2022.  The following were discussed during today's consultation:   Monochorionic, diamniotic twins  The implications and management of monochorionic twins was discussed.   The 10% to 15% risk of twin to twin transfusion syndrome seen in monochorionic, diamniotic twins was discussed today.    The implications and management of twin to twin transfusion syndrome (TTTS) should she develop this complication was also discussed.  She was advised that we will continue to follow her closely with serial ultrasounds to assess for signs of TTTS.  She was advised that management of twin pregnancies will involve frequent ultrasound exams to assess the fetal growth and amniotic fluid level.    We will continue to follow her with ultrasounds every 2 weeks to assess for signs of TTTS.   Weekly fetal testing should be started at around 32 weeks.    Delivery for uncomplicated monochorionic twins is recommended at around 37 weeks.  The increased risk of preeclampsia, gestational diabetes, and preterm birth/labor associated with twin pregnancies was discussed.   As pregnancies with multiple gestations are at increased  risk for developing preeclampsia, she was advised to continue taking 2 baby aspirin (81 mg each) pills per day to decrease her risk of developing preeclampsia.   Increased nuchal translucency in twin A  The association of an increased NT with fetal aneuploidy and congenital heart defects was discussed.  She was also reassured that an increased NT may be a normal variant.  Although an increased NT may be associated with an increased risk of fetal aneuploidy, she was reassured by the low risk indicated by her cell free DNA test.   Due to the increased NT and monochorionic twin gestation, she already has a fetal echocardiogram scheduled with [facility] pediatric cardiology at around 20 weeks.    A follow-up exam was scheduled in 3 weeks for assessment of TTTS.    The patient stated that all of her questions were answered today.  A total of 45 minutes was spent counseling and coordinating the care for this patient.  Greater than 50% of the time was spent in direct face-to-face contact.

## 2022-03-24 DIAGNOSIS — M9903 Segmental and somatic dysfunction of lumbar region: Secondary | ICD-10-CM | POA: Diagnosis not present

## 2022-03-24 DIAGNOSIS — M5432 Sciatica, left side: Secondary | ICD-10-CM | POA: Diagnosis not present

## 2022-04-02 DIAGNOSIS — Z23 Encounter for immunization: Secondary | ICD-10-CM | POA: Diagnosis not present

## 2022-04-02 DIAGNOSIS — Z3481 Encounter for supervision of other normal pregnancy, first trimester: Secondary | ICD-10-CM | POA: Diagnosis not present

## 2022-04-05 ENCOUNTER — Ambulatory Visit: Payer: BC Managed Care – PPO | Attending: Obstetrics

## 2022-04-05 ENCOUNTER — Ambulatory Visit: Payer: BC Managed Care – PPO | Admitting: *Deleted

## 2022-04-05 VITALS — BP 139/84 | HR 99

## 2022-04-05 DIAGNOSIS — O30032 Twin pregnancy, monochorionic/diamniotic, second trimester: Secondary | ICD-10-CM

## 2022-04-05 DIAGNOSIS — O30031 Twin pregnancy, monochorionic/diamniotic, first trimester: Secondary | ICD-10-CM | POA: Insufficient documentation

## 2022-04-05 DIAGNOSIS — O99282 Endocrine, nutritional and metabolic diseases complicating pregnancy, second trimester: Secondary | ICD-10-CM

## 2022-04-05 DIAGNOSIS — O09899 Supervision of other high risk pregnancies, unspecified trimester: Secondary | ICD-10-CM | POA: Diagnosis not present

## 2022-04-05 DIAGNOSIS — O10012 Pre-existing essential hypertension complicating pregnancy, second trimester: Secondary | ICD-10-CM

## 2022-04-05 DIAGNOSIS — Z3A16 16 weeks gestation of pregnancy: Secondary | ICD-10-CM

## 2022-04-05 DIAGNOSIS — O280 Abnormal hematological finding on antenatal screening of mother: Secondary | ICD-10-CM

## 2022-04-05 DIAGNOSIS — Z363 Encounter for antenatal screening for malformations: Secondary | ICD-10-CM

## 2022-04-05 DIAGNOSIS — O09892 Supervision of other high risk pregnancies, second trimester: Secondary | ICD-10-CM

## 2022-04-05 DIAGNOSIS — E079 Disorder of thyroid, unspecified: Secondary | ICD-10-CM

## 2022-04-05 DIAGNOSIS — O10911 Unspecified pre-existing hypertension complicating pregnancy, first trimester: Secondary | ICD-10-CM | POA: Diagnosis not present

## 2022-04-07 DIAGNOSIS — M5432 Sciatica, left side: Secondary | ICD-10-CM | POA: Diagnosis not present

## 2022-04-07 DIAGNOSIS — M9903 Segmental and somatic dysfunction of lumbar region: Secondary | ICD-10-CM | POA: Diagnosis not present

## 2022-04-16 ENCOUNTER — Ambulatory Visit: Payer: BC Managed Care – PPO | Admitting: *Deleted

## 2022-04-16 ENCOUNTER — Ambulatory Visit: Payer: BC Managed Care – PPO

## 2022-04-16 ENCOUNTER — Ambulatory Visit: Payer: BC Managed Care – PPO | Attending: Obstetrics

## 2022-04-16 ENCOUNTER — Other Ambulatory Visit: Payer: Self-pay | Admitting: *Deleted

## 2022-04-16 VITALS — BP 146/84 | HR 101

## 2022-04-16 DIAGNOSIS — O99282 Endocrine, nutritional and metabolic diseases complicating pregnancy, second trimester: Secondary | ICD-10-CM

## 2022-04-16 DIAGNOSIS — O30031 Twin pregnancy, monochorionic/diamniotic, first trimester: Secondary | ICD-10-CM | POA: Insufficient documentation

## 2022-04-16 DIAGNOSIS — O10012 Pre-existing essential hypertension complicating pregnancy, second trimester: Secondary | ICD-10-CM | POA: Diagnosis not present

## 2022-04-16 DIAGNOSIS — O09211 Supervision of pregnancy with history of pre-term labor, first trimester: Secondary | ICD-10-CM | POA: Insufficient documentation

## 2022-04-16 DIAGNOSIS — O30032 Twin pregnancy, monochorionic/diamniotic, second trimester: Secondary | ICD-10-CM | POA: Insufficient documentation

## 2022-04-16 DIAGNOSIS — E079 Disorder of thyroid, unspecified: Secondary | ICD-10-CM

## 2022-04-16 DIAGNOSIS — O10911 Unspecified pre-existing hypertension complicating pregnancy, first trimester: Secondary | ICD-10-CM | POA: Insufficient documentation

## 2022-04-16 DIAGNOSIS — Z3A18 18 weeks gestation of pregnancy: Secondary | ICD-10-CM

## 2022-04-19 ENCOUNTER — Other Ambulatory Visit: Payer: Self-pay | Admitting: *Deleted

## 2022-04-19 DIAGNOSIS — O30032 Twin pregnancy, monochorionic/diamniotic, second trimester: Secondary | ICD-10-CM

## 2022-04-21 DIAGNOSIS — M5432 Sciatica, left side: Secondary | ICD-10-CM | POA: Diagnosis not present

## 2022-04-21 DIAGNOSIS — M9903 Segmental and somatic dysfunction of lumbar region: Secondary | ICD-10-CM | POA: Diagnosis not present

## 2022-04-26 DIAGNOSIS — Z3A19 19 weeks gestation of pregnancy: Secondary | ICD-10-CM | POA: Diagnosis not present

## 2022-04-26 DIAGNOSIS — O30032 Twin pregnancy, monochorionic/diamniotic, second trimester: Secondary | ICD-10-CM | POA: Diagnosis not present

## 2022-04-27 DIAGNOSIS — O30009 Twin pregnancy, unspecified number of placenta and unspecified number of amniotic sacs, unspecified trimester: Secondary | ICD-10-CM | POA: Diagnosis not present

## 2022-05-03 ENCOUNTER — Other Ambulatory Visit: Payer: Self-pay | Admitting: Maternal & Fetal Medicine

## 2022-05-03 ENCOUNTER — Ambulatory Visit: Payer: BC Managed Care – PPO | Attending: Maternal & Fetal Medicine

## 2022-05-03 ENCOUNTER — Ambulatory Visit: Payer: BC Managed Care – PPO | Admitting: *Deleted

## 2022-05-03 VITALS — BP 128/69 | HR 84

## 2022-05-03 DIAGNOSIS — E079 Disorder of thyroid, unspecified: Secondary | ICD-10-CM

## 2022-05-03 DIAGNOSIS — O99280 Endocrine, nutritional and metabolic diseases complicating pregnancy, unspecified trimester: Secondary | ICD-10-CM | POA: Diagnosis not present

## 2022-05-03 DIAGNOSIS — O10012 Pre-existing essential hypertension complicating pregnancy, second trimester: Secondary | ICD-10-CM

## 2022-05-03 DIAGNOSIS — O10912 Unspecified pre-existing hypertension complicating pregnancy, second trimester: Secondary | ICD-10-CM | POA: Insufficient documentation

## 2022-05-03 DIAGNOSIS — O30032 Twin pregnancy, monochorionic/diamniotic, second trimester: Secondary | ICD-10-CM

## 2022-05-03 DIAGNOSIS — Z3A20 20 weeks gestation of pregnancy: Secondary | ICD-10-CM

## 2022-05-03 DIAGNOSIS — O09212 Supervision of pregnancy with history of pre-term labor, second trimester: Secondary | ICD-10-CM

## 2022-05-05 DIAGNOSIS — M9903 Segmental and somatic dysfunction of lumbar region: Secondary | ICD-10-CM | POA: Diagnosis not present

## 2022-05-05 DIAGNOSIS — M5432 Sciatica, left side: Secondary | ICD-10-CM | POA: Diagnosis not present

## 2022-05-17 ENCOUNTER — Ambulatory Visit: Payer: BC Managed Care – PPO | Admitting: *Deleted

## 2022-05-17 ENCOUNTER — Ambulatory Visit: Payer: BC Managed Care – PPO | Attending: Maternal & Fetal Medicine

## 2022-05-17 VITALS — BP 131/71 | HR 82

## 2022-05-17 DIAGNOSIS — O30032 Twin pregnancy, monochorionic/diamniotic, second trimester: Secondary | ICD-10-CM | POA: Diagnosis not present

## 2022-05-17 DIAGNOSIS — Z3689 Encounter for other specified antenatal screening: Secondary | ICD-10-CM | POA: Diagnosis not present

## 2022-05-17 DIAGNOSIS — E079 Disorder of thyroid, unspecified: Secondary | ICD-10-CM

## 2022-05-17 DIAGNOSIS — Z3A32 32 weeks gestation of pregnancy: Secondary | ICD-10-CM

## 2022-05-17 DIAGNOSIS — O99282 Endocrine, nutritional and metabolic diseases complicating pregnancy, second trimester: Secondary | ICD-10-CM | POA: Diagnosis not present

## 2022-05-17 DIAGNOSIS — O09212 Supervision of pregnancy with history of pre-term labor, second trimester: Secondary | ICD-10-CM

## 2022-05-17 DIAGNOSIS — O10012 Pre-existing essential hypertension complicating pregnancy, second trimester: Secondary | ICD-10-CM

## 2022-05-19 DIAGNOSIS — M9903 Segmental and somatic dysfunction of lumbar region: Secondary | ICD-10-CM | POA: Diagnosis not present

## 2022-05-19 DIAGNOSIS — M5432 Sciatica, left side: Secondary | ICD-10-CM | POA: Diagnosis not present

## 2022-05-28 ENCOUNTER — Ambulatory Visit: Payer: BC Managed Care – PPO | Attending: Maternal & Fetal Medicine

## 2022-05-28 ENCOUNTER — Ambulatory Visit: Payer: BC Managed Care – PPO | Admitting: *Deleted

## 2022-05-28 ENCOUNTER — Ambulatory Visit: Payer: BC Managed Care – PPO

## 2022-05-28 ENCOUNTER — Other Ambulatory Visit: Payer: Self-pay | Admitting: *Deleted

## 2022-05-28 VITALS — BP 113/66 | HR 83

## 2022-05-28 DIAGNOSIS — Z3A24 24 weeks gestation of pregnancy: Secondary | ICD-10-CM

## 2022-05-28 DIAGNOSIS — O30032 Twin pregnancy, monochorionic/diamniotic, second trimester: Secondary | ICD-10-CM

## 2022-05-28 DIAGNOSIS — O10012 Pre-existing essential hypertension complicating pregnancy, second trimester: Secondary | ICD-10-CM

## 2022-05-28 DIAGNOSIS — O09212 Supervision of pregnancy with history of pre-term labor, second trimester: Secondary | ICD-10-CM

## 2022-05-28 DIAGNOSIS — O10912 Unspecified pre-existing hypertension complicating pregnancy, second trimester: Secondary | ICD-10-CM | POA: Diagnosis not present

## 2022-05-28 DIAGNOSIS — E079 Disorder of thyroid, unspecified: Secondary | ICD-10-CM

## 2022-05-28 DIAGNOSIS — O99282 Endocrine, nutritional and metabolic diseases complicating pregnancy, second trimester: Secondary | ICD-10-CM

## 2022-06-02 DIAGNOSIS — M9903 Segmental and somatic dysfunction of lumbar region: Secondary | ICD-10-CM | POA: Diagnosis not present

## 2022-06-02 DIAGNOSIS — M5432 Sciatica, left side: Secondary | ICD-10-CM | POA: Diagnosis not present

## 2022-06-10 ENCOUNTER — Ambulatory Visit (HOSPITAL_BASED_OUTPATIENT_CLINIC_OR_DEPARTMENT_OTHER): Payer: BC Managed Care – PPO

## 2022-06-10 ENCOUNTER — Other Ambulatory Visit: Payer: Self-pay | Admitting: *Deleted

## 2022-06-10 ENCOUNTER — Ambulatory Visit: Payer: BC Managed Care – PPO | Attending: Maternal & Fetal Medicine | Admitting: *Deleted

## 2022-06-10 VITALS — BP 121/73 | HR 84

## 2022-06-10 DIAGNOSIS — O10012 Pre-existing essential hypertension complicating pregnancy, second trimester: Secondary | ICD-10-CM

## 2022-06-10 DIAGNOSIS — O99283 Endocrine, nutritional and metabolic diseases complicating pregnancy, third trimester: Secondary | ICD-10-CM

## 2022-06-10 DIAGNOSIS — O10912 Unspecified pre-existing hypertension complicating pregnancy, second trimester: Secondary | ICD-10-CM

## 2022-06-10 DIAGNOSIS — Z3A26 26 weeks gestation of pregnancy: Secondary | ICD-10-CM

## 2022-06-10 DIAGNOSIS — O10913 Unspecified pre-existing hypertension complicating pregnancy, third trimester: Secondary | ICD-10-CM | POA: Diagnosis not present

## 2022-06-10 DIAGNOSIS — O30032 Twin pregnancy, monochorionic/diamniotic, second trimester: Secondary | ICD-10-CM

## 2022-06-10 DIAGNOSIS — O09213 Supervision of pregnancy with history of pre-term labor, third trimester: Secondary | ICD-10-CM | POA: Insufficient documentation

## 2022-06-10 DIAGNOSIS — O280 Abnormal hematological finding on antenatal screening of mother: Secondary | ICD-10-CM

## 2022-06-10 DIAGNOSIS — O09293 Supervision of pregnancy with other poor reproductive or obstetric history, third trimester: Secondary | ICD-10-CM | POA: Diagnosis not present

## 2022-06-10 DIAGNOSIS — E079 Disorder of thyroid, unspecified: Secondary | ICD-10-CM | POA: Diagnosis not present

## 2022-06-10 DIAGNOSIS — O09212 Supervision of pregnancy with history of pre-term labor, second trimester: Secondary | ICD-10-CM

## 2022-06-10 DIAGNOSIS — Z3A27 27 weeks gestation of pregnancy: Secondary | ICD-10-CM | POA: Insufficient documentation

## 2022-06-11 ENCOUNTER — Ambulatory Visit: Payer: BC Managed Care – PPO

## 2022-06-25 ENCOUNTER — Ambulatory Visit: Payer: BC Managed Care – PPO | Attending: Maternal & Fetal Medicine

## 2022-06-25 ENCOUNTER — Ambulatory Visit: Payer: BC Managed Care – PPO | Admitting: *Deleted

## 2022-06-25 VITALS — BP 127/70 | HR 90

## 2022-06-25 DIAGNOSIS — Z23 Encounter for immunization: Secondary | ICD-10-CM | POA: Diagnosis not present

## 2022-06-25 DIAGNOSIS — O10913 Unspecified pre-existing hypertension complicating pregnancy, third trimester: Secondary | ICD-10-CM | POA: Diagnosis not present

## 2022-06-25 DIAGNOSIS — O99283 Endocrine, nutritional and metabolic diseases complicating pregnancy, third trimester: Secondary | ICD-10-CM | POA: Diagnosis not present

## 2022-06-25 DIAGNOSIS — Z3480 Encounter for supervision of other normal pregnancy, unspecified trimester: Secondary | ICD-10-CM | POA: Diagnosis not present

## 2022-06-25 DIAGNOSIS — O10013 Pre-existing essential hypertension complicating pregnancy, third trimester: Secondary | ICD-10-CM | POA: Diagnosis not present

## 2022-06-25 DIAGNOSIS — O30032 Twin pregnancy, monochorionic/diamniotic, second trimester: Secondary | ICD-10-CM

## 2022-06-25 DIAGNOSIS — E079 Disorder of thyroid, unspecified: Secondary | ICD-10-CM | POA: Diagnosis not present

## 2022-06-25 DIAGNOSIS — O09213 Supervision of pregnancy with history of pre-term labor, third trimester: Secondary | ICD-10-CM

## 2022-06-25 DIAGNOSIS — O30033 Twin pregnancy, monochorionic/diamniotic, third trimester: Secondary | ICD-10-CM | POA: Diagnosis not present

## 2022-06-25 DIAGNOSIS — Z3A28 28 weeks gestation of pregnancy: Secondary | ICD-10-CM

## 2022-06-29 DIAGNOSIS — M5432 Sciatica, left side: Secondary | ICD-10-CM | POA: Diagnosis not present

## 2022-06-29 DIAGNOSIS — M9903 Segmental and somatic dysfunction of lumbar region: Secondary | ICD-10-CM | POA: Diagnosis not present

## 2022-07-09 ENCOUNTER — Ambulatory Visit: Payer: BC Managed Care – PPO | Attending: Obstetrics and Gynecology

## 2022-07-09 ENCOUNTER — Ambulatory Visit: Payer: BC Managed Care – PPO | Admitting: *Deleted

## 2022-07-09 VITALS — BP 118/75 | HR 85

## 2022-07-09 DIAGNOSIS — Z3A30 30 weeks gestation of pregnancy: Secondary | ICD-10-CM

## 2022-07-09 DIAGNOSIS — E079 Disorder of thyroid, unspecified: Secondary | ICD-10-CM

## 2022-07-09 DIAGNOSIS — O99210 Obesity complicating pregnancy, unspecified trimester: Secondary | ICD-10-CM | POA: Insufficient documentation

## 2022-07-09 DIAGNOSIS — O30032 Twin pregnancy, monochorionic/diamniotic, second trimester: Secondary | ICD-10-CM | POA: Diagnosis not present

## 2022-07-09 DIAGNOSIS — O99283 Endocrine, nutritional and metabolic diseases complicating pregnancy, third trimester: Secondary | ICD-10-CM

## 2022-07-09 DIAGNOSIS — O10013 Pre-existing essential hypertension complicating pregnancy, third trimester: Secondary | ICD-10-CM | POA: Diagnosis not present

## 2022-07-09 DIAGNOSIS — O30033 Twin pregnancy, monochorionic/diamniotic, third trimester: Secondary | ICD-10-CM | POA: Diagnosis not present

## 2022-07-09 DIAGNOSIS — O09213 Supervision of pregnancy with history of pre-term labor, third trimester: Secondary | ICD-10-CM

## 2022-07-12 ENCOUNTER — Other Ambulatory Visit: Payer: Self-pay | Admitting: *Deleted

## 2022-07-12 DIAGNOSIS — O30033 Twin pregnancy, monochorionic/diamniotic, third trimester: Secondary | ICD-10-CM

## 2022-07-12 DIAGNOSIS — O10913 Unspecified pre-existing hypertension complicating pregnancy, third trimester: Secondary | ICD-10-CM

## 2022-07-13 ENCOUNTER — Other Ambulatory Visit: Payer: Self-pay

## 2022-07-13 DIAGNOSIS — O10913 Unspecified pre-existing hypertension complicating pregnancy, third trimester: Secondary | ICD-10-CM

## 2022-07-13 DIAGNOSIS — O30033 Twin pregnancy, monochorionic/diamniotic, third trimester: Secondary | ICD-10-CM

## 2022-07-14 ENCOUNTER — Encounter: Payer: Self-pay | Admitting: Nurse Practitioner

## 2022-07-15 ENCOUNTER — Other Ambulatory Visit: Payer: Self-pay

## 2022-07-15 MED ORDER — ALBUTEROL SULFATE HFA 108 (90 BASE) MCG/ACT IN AERS: 2.0000 | INHALATION_SPRAY | Freq: Four times a day (QID) | RESPIRATORY_TRACT | 6 refills | Status: AC | PRN

## 2022-07-19 DIAGNOSIS — M5432 Sciatica, left side: Secondary | ICD-10-CM | POA: Diagnosis not present

## 2022-07-19 DIAGNOSIS — M9903 Segmental and somatic dysfunction of lumbar region: Secondary | ICD-10-CM | POA: Diagnosis not present

## 2022-07-23 ENCOUNTER — Ambulatory Visit: Payer: BC Managed Care – PPO | Admitting: *Deleted

## 2022-07-23 ENCOUNTER — Other Ambulatory Visit: Payer: Self-pay | Admitting: *Deleted

## 2022-07-23 ENCOUNTER — Ambulatory Visit: Payer: BC Managed Care – PPO | Attending: Obstetrics and Gynecology

## 2022-07-23 VITALS — BP 118/75 | HR 91

## 2022-07-23 DIAGNOSIS — Z3A32 32 weeks gestation of pregnancy: Secondary | ICD-10-CM

## 2022-07-23 DIAGNOSIS — E079 Disorder of thyroid, unspecified: Secondary | ICD-10-CM | POA: Diagnosis not present

## 2022-07-23 DIAGNOSIS — O30039 Twin pregnancy, monochorionic/diamniotic, unspecified trimester: Secondary | ICD-10-CM

## 2022-07-23 DIAGNOSIS — O30033 Twin pregnancy, monochorionic/diamniotic, third trimester: Secondary | ICD-10-CM

## 2022-07-23 DIAGNOSIS — O10013 Pre-existing essential hypertension complicating pregnancy, third trimester: Secondary | ICD-10-CM | POA: Diagnosis not present

## 2022-07-23 DIAGNOSIS — O10913 Unspecified pre-existing hypertension complicating pregnancy, third trimester: Secondary | ICD-10-CM | POA: Insufficient documentation

## 2022-07-23 DIAGNOSIS — O99283 Endocrine, nutritional and metabolic diseases complicating pregnancy, third trimester: Secondary | ICD-10-CM

## 2022-07-23 DIAGNOSIS — O30032 Twin pregnancy, monochorionic/diamniotic, second trimester: Secondary | ICD-10-CM | POA: Diagnosis not present

## 2022-07-23 DIAGNOSIS — O09213 Supervision of pregnancy with history of pre-term labor, third trimester: Secondary | ICD-10-CM

## 2022-07-30 ENCOUNTER — Ambulatory Visit: Payer: BC Managed Care – PPO | Attending: Obstetrics and Gynecology

## 2022-07-30 ENCOUNTER — Ambulatory Visit: Payer: BC Managed Care – PPO | Admitting: *Deleted

## 2022-07-30 VITALS — BP 127/75 | HR 87

## 2022-07-30 DIAGNOSIS — O30033 Twin pregnancy, monochorionic/diamniotic, third trimester: Secondary | ICD-10-CM

## 2022-07-30 DIAGNOSIS — O10913 Unspecified pre-existing hypertension complicating pregnancy, third trimester: Secondary | ICD-10-CM | POA: Diagnosis not present

## 2022-07-30 DIAGNOSIS — O99283 Endocrine, nutritional and metabolic diseases complicating pregnancy, third trimester: Secondary | ICD-10-CM

## 2022-07-30 DIAGNOSIS — O10013 Pre-existing essential hypertension complicating pregnancy, third trimester: Secondary | ICD-10-CM

## 2022-07-30 DIAGNOSIS — E079 Disorder of thyroid, unspecified: Secondary | ICD-10-CM

## 2022-07-30 DIAGNOSIS — Z3A33 33 weeks gestation of pregnancy: Secondary | ICD-10-CM

## 2022-07-30 DIAGNOSIS — O30032 Twin pregnancy, monochorionic/diamniotic, second trimester: Secondary | ICD-10-CM | POA: Diagnosis not present

## 2022-07-30 DIAGNOSIS — O09213 Supervision of pregnancy with history of pre-term labor, third trimester: Secondary | ICD-10-CM

## 2022-08-02 DIAGNOSIS — M5432 Sciatica, left side: Secondary | ICD-10-CM | POA: Diagnosis not present

## 2022-08-02 DIAGNOSIS — M9903 Segmental and somatic dysfunction of lumbar region: Secondary | ICD-10-CM | POA: Diagnosis not present

## 2022-08-03 ENCOUNTER — Ambulatory Visit (HOSPITAL_BASED_OUTPATIENT_CLINIC_OR_DEPARTMENT_OTHER): Payer: BC Managed Care – PPO | Admitting: Nurse Practitioner

## 2022-08-04 DIAGNOSIS — O169 Unspecified maternal hypertension, unspecified trimester: Secondary | ICD-10-CM | POA: Diagnosis not present

## 2022-08-06 ENCOUNTER — Ambulatory Visit: Payer: BC Managed Care – PPO | Attending: Obstetrics and Gynecology

## 2022-08-06 ENCOUNTER — Ambulatory Visit: Payer: BC Managed Care – PPO | Admitting: *Deleted

## 2022-08-06 VITALS — BP 103/65 | HR 93

## 2022-08-06 DIAGNOSIS — O10913 Unspecified pre-existing hypertension complicating pregnancy, third trimester: Secondary | ICD-10-CM | POA: Insufficient documentation

## 2022-08-06 DIAGNOSIS — Z3A34 34 weeks gestation of pregnancy: Secondary | ICD-10-CM

## 2022-08-06 DIAGNOSIS — O99283 Endocrine, nutritional and metabolic diseases complicating pregnancy, third trimester: Secondary | ICD-10-CM

## 2022-08-06 DIAGNOSIS — O09213 Supervision of pregnancy with history of pre-term labor, third trimester: Secondary | ICD-10-CM

## 2022-08-06 DIAGNOSIS — E079 Disorder of thyroid, unspecified: Secondary | ICD-10-CM

## 2022-08-06 DIAGNOSIS — O30033 Twin pregnancy, monochorionic/diamniotic, third trimester: Secondary | ICD-10-CM | POA: Insufficient documentation

## 2022-08-06 DIAGNOSIS — O10013 Pre-existing essential hypertension complicating pregnancy, third trimester: Secondary | ICD-10-CM | POA: Diagnosis not present

## 2022-08-11 ENCOUNTER — Other Ambulatory Visit (HOSPITAL_BASED_OUTPATIENT_CLINIC_OR_DEPARTMENT_OTHER): Payer: Self-pay | Admitting: Nurse Practitioner

## 2022-08-11 DIAGNOSIS — E039 Hypothyroidism, unspecified: Secondary | ICD-10-CM

## 2022-08-12 ENCOUNTER — Encounter (HOSPITAL_COMMUNITY): Payer: Self-pay

## 2022-08-12 ENCOUNTER — Telehealth (HOSPITAL_COMMUNITY): Payer: Self-pay | Admitting: *Deleted

## 2022-08-12 NOTE — Patient Instructions (Signed)
Christy Rivera  08/12/2022   Your procedure is scheduled on:  08/26/2022  Arrive at [facility] at Entrance C on [address] at [facility]  and [address]. You are invited to use the FREE valet parking or use the Visitor's parking deck.  Pick up the phone at the desk and dial [phone].  Call this number if you have problems the morning of surgery: [phone]  Remember:   Do not eat food:(After Midnight) Desps de medianoche.  Do not drink clear liquids: (After Midnight) Desps de medianoche.  Take these medicines the morning of surgery with A SIP OF WATER:  Bring your inhaler with you.  Take your labetalol and levothyroxine as prescribed   Do not wear jewelry, make-up or nail polish.  Do not wear lotions, powders, or perfumes. Do not wear deodorant.  Do not shave 48 hours prior to surgery.  Do not bring valuables to the hospital.  [facility] is not   responsible for any belongings or valuables brought to the hospital.  Contacts, dentures or bridgework may not be worn into surgery.  Leave suitcase in the car. After surgery it may be brought to your room.  For patients admitted to the hospital, checkout time is 11:00 AM the day of              discharge.      Please read over the following fact sheets that you were given:     Preparing for Surgery

## 2022-08-12 NOTE — Telephone Encounter (Signed)
Preadmission screen  

## 2022-08-13 ENCOUNTER — Other Ambulatory Visit: Payer: Self-pay | Admitting: Obstetrics and Gynecology

## 2022-08-13 ENCOUNTER — Ambulatory Visit: Payer: BC Managed Care – PPO | Admitting: *Deleted

## 2022-08-13 ENCOUNTER — Ambulatory Visit: Payer: BC Managed Care – PPO | Attending: Obstetrics and Gynecology

## 2022-08-13 VITALS — BP 113/73 | HR 92

## 2022-08-13 DIAGNOSIS — O283 Abnormal ultrasonic finding on antenatal screening of mother: Secondary | ICD-10-CM

## 2022-08-13 DIAGNOSIS — O10913 Unspecified pre-existing hypertension complicating pregnancy, third trimester: Secondary | ICD-10-CM

## 2022-08-13 DIAGNOSIS — O10013 Pre-existing essential hypertension complicating pregnancy, third trimester: Secondary | ICD-10-CM | POA: Diagnosis not present

## 2022-08-13 DIAGNOSIS — Z3A35 35 weeks gestation of pregnancy: Secondary | ICD-10-CM

## 2022-08-13 DIAGNOSIS — O30033 Twin pregnancy, monochorionic/diamniotic, third trimester: Secondary | ICD-10-CM | POA: Insufficient documentation

## 2022-08-13 DIAGNOSIS — O30039 Twin pregnancy, monochorionic/diamniotic, unspecified trimester: Secondary | ICD-10-CM

## 2022-08-13 DIAGNOSIS — O09213 Supervision of pregnancy with history of pre-term labor, third trimester: Secondary | ICD-10-CM

## 2022-08-13 DIAGNOSIS — E079 Disorder of thyroid, unspecified: Secondary | ICD-10-CM | POA: Diagnosis not present

## 2022-08-13 DIAGNOSIS — O99283 Endocrine, nutritional and metabolic diseases complicating pregnancy, third trimester: Secondary | ICD-10-CM

## 2022-08-13 DIAGNOSIS — Z3685 Encounter for antenatal screening for Streptococcus B: Secondary | ICD-10-CM | POA: Diagnosis not present

## 2022-08-13 NOTE — Procedures (Signed)
Christy Rivera 05-Feb-1991 [redacted]w[redacted]d  Fetus B Non-Stress Test Interpretation for 08/13/22  Indication: Unsatisfactory BPP  Fetal Heart Rate Fetus B Mode: External Baseline Rate (B): 150 BPM (baseling change to 140) Variability: Moderate Accelerations: 15 x 15 Decelerations: Variable  Uterine Activity Mode: Toco Contraction Frequency (min): One UC Contraction Duration (sec): 50 Contraction Quality: Mild Resting Tone Palpated: Relaxed Resting Time: Adequate  Interpretation (Baby B - Fetal Testing) Nonstress Test Interpretation (Baby B): Reactive Overall Impression (Baby B): Reassuring for gestational age Comments (Baby B): Tracing reviewed by Dr. FEstanislado EmmsBAnderson [id number] A Non-Stress Test Interpretation for 08/13/22  Indication:  twin preg  Fetal Heart Rate A Mode: External Baseline Rate (A): 130 bpm Variability: Moderate Accelerations: 15 x 15 Decelerations: None Multiple birth?: Yes  Uterine Activity Mode: Toco Contraction Frequency (min): One UC Contraction Duration (sec): 50 Contraction Quality: Mild Resting Tone Palpated: Relaxed Resting Time: Adequate  Interpretation (Fetal Testing) Nonstress Test Interpretation: Reactive Overall Impression: Reassuring for gestational age Comments: Tracing reviewed by Dr. FaAnnamaria Boots

## 2022-08-16 DIAGNOSIS — M9903 Segmental and somatic dysfunction of lumbar region: Secondary | ICD-10-CM | POA: Diagnosis not present

## 2022-08-16 DIAGNOSIS — M5432 Sciatica, left side: Secondary | ICD-10-CM | POA: Diagnosis not present

## 2022-08-20 ENCOUNTER — Ambulatory Visit: Payer: BC Managed Care – PPO | Admitting: *Deleted

## 2022-08-20 ENCOUNTER — Ambulatory Visit: Payer: BC Managed Care – PPO | Attending: Obstetrics and Gynecology

## 2022-08-20 VITALS — BP 121/86 | HR 88

## 2022-08-20 DIAGNOSIS — O10913 Unspecified pre-existing hypertension complicating pregnancy, third trimester: Secondary | ICD-10-CM | POA: Diagnosis not present

## 2022-08-20 DIAGNOSIS — O99283 Endocrine, nutritional and metabolic diseases complicating pregnancy, third trimester: Secondary | ICD-10-CM | POA: Diagnosis not present

## 2022-08-20 DIAGNOSIS — O10013 Pre-existing essential hypertension complicating pregnancy, third trimester: Secondary | ICD-10-CM

## 2022-08-20 DIAGNOSIS — E079 Disorder of thyroid, unspecified: Secondary | ICD-10-CM

## 2022-08-20 DIAGNOSIS — Z3A36 36 weeks gestation of pregnancy: Secondary | ICD-10-CM

## 2022-08-20 DIAGNOSIS — O30033 Twin pregnancy, monochorionic/diamniotic, third trimester: Secondary | ICD-10-CM | POA: Diagnosis not present

## 2022-08-20 DIAGNOSIS — O35EXX1 Maternal care for other (suspected) fetal abnormality and damage, fetal genitourinary anomalies, fetus 1: Secondary | ICD-10-CM

## 2022-08-20 DIAGNOSIS — O09213 Supervision of pregnancy with history of pre-term labor, third trimester: Secondary | ICD-10-CM

## 2022-08-24 ENCOUNTER — Encounter (HOSPITAL_COMMUNITY)
Admission: RE | Admit: 2022-08-24 | Discharge: 2022-08-24 | Disposition: A | Discharge status: Home / Self Care | Payer: BC Managed Care – PPO | Source: Ambulatory Visit | Attending: Obstetrics & Gynecology | Admitting: Obstetrics & Gynecology

## 2022-08-24 DIAGNOSIS — O321XX2 Maternal care for breech presentation, fetus 2: Secondary | ICD-10-CM | POA: Diagnosis not present

## 2022-08-24 DIAGNOSIS — J45909 Unspecified asthma, uncomplicated: Secondary | ICD-10-CM | POA: Diagnosis not present

## 2022-08-24 DIAGNOSIS — O99284 Endocrine, nutritional and metabolic diseases complicating childbirth: Secondary | ICD-10-CM | POA: Diagnosis not present

## 2022-08-24 DIAGNOSIS — Z3A00 Weeks of gestation of pregnancy not specified: Secondary | ICD-10-CM | POA: Diagnosis not present

## 2022-08-24 DIAGNOSIS — E039 Hypothyroidism, unspecified: Secondary | ICD-10-CM | POA: Diagnosis not present

## 2022-08-24 DIAGNOSIS — O1092 Unspecified pre-existing hypertension complicating childbirth: Secondary | ICD-10-CM | POA: Diagnosis not present

## 2022-08-24 DIAGNOSIS — O321XX1 Maternal care for breech presentation, fetus 1: Secondary | ICD-10-CM | POA: Diagnosis not present

## 2022-08-24 DIAGNOSIS — O133 Gestational [pregnancy-induced] hypertension without significant proteinuria, third trimester: Secondary | ICD-10-CM | POA: Diagnosis not present

## 2022-08-24 DIAGNOSIS — O99214 Obesity complicating childbirth: Secondary | ICD-10-CM | POA: Diagnosis not present

## 2022-08-24 DIAGNOSIS — Z01812 Encounter for preprocedural laboratory examination: Secondary | ICD-10-CM | POA: Insufficient documentation

## 2022-08-24 DIAGNOSIS — Z3A37 37 weeks gestation of pregnancy: Secondary | ICD-10-CM | POA: Diagnosis not present

## 2022-08-24 DIAGNOSIS — O30039 Twin pregnancy, monochorionic/diamniotic, unspecified trimester: Secondary | ICD-10-CM | POA: Diagnosis not present

## 2022-08-24 DIAGNOSIS — O99824 Streptococcus B carrier state complicating childbirth: Secondary | ICD-10-CM | POA: Diagnosis not present

## 2022-08-24 DIAGNOSIS — O30033 Twin pregnancy, monochorionic/diamniotic, third trimester: Secondary | ICD-10-CM

## 2022-08-24 DIAGNOSIS — O30043 Twin pregnancy, dichorionic/diamniotic, third trimester: Secondary | ICD-10-CM | POA: Diagnosis not present

## 2022-08-24 DIAGNOSIS — O9952 Diseases of the respiratory system complicating childbirth: Secondary | ICD-10-CM | POA: Diagnosis not present

## 2022-08-24 DIAGNOSIS — O1002 Pre-existing essential hypertension complicating childbirth: Secondary | ICD-10-CM | POA: Diagnosis not present

## 2022-08-24 LAB — COMPREHENSIVE METABOLIC PANEL
ALT: 21 U/L (ref 0–44)
AST: 26 U/L (ref 15–41)
Albumin: 2.7 g/dL — ABNORMAL LOW (ref 3.5–5.0)
Alkaline Phosphatase: 127 U/L — ABNORMAL HIGH (ref 38–126)
Anion gap: 12 (ref 5–15)
BUN: 11 mg/dL (ref 6–20)
CO2: 18 mmol/L — ABNORMAL LOW (ref 22–32)
Calcium: 8.7 mg/dL — ABNORMAL LOW (ref 8.9–10.3)
Chloride: 104 mmol/L (ref 98–111)
Creatinine, Ser: 0.78 mg/dL (ref 0.44–1.00)
GFR, Estimated: >60 mL/min (ref >60)
Glucose, Bld: 105 mg/dL — ABNORMAL HIGH (ref 70–99)
Potassium: 4.1 mmol/L (ref 3.5–5.1)
Sodium: 134 mmol/L — ABNORMAL LOW (ref 135–145)
Total Bilirubin: 0.5 mg/dL (ref 0.3–1.2)
Total Protein: 6.1 g/dL — ABNORMAL LOW (ref 6.5–8.1)

## 2022-08-24 LAB — RPR: RPR Ser Ql: NONREACTIVE

## 2022-08-24 LAB — CBC
HCT: 36.2 % (ref 36.0–46.0)
Hemoglobin: 12.6 g/dL (ref 12.0–15.0)
MCH: 30 pg (ref 26.0–34.0)
MCHC: 34.8 g/dL (ref 30.0–36.0)
MCV: 86.2 fL (ref 80.0–100.0)
Platelets: 211 10*3/uL (ref 150–400)
RBC: 4.2 MIL/uL (ref 3.87–5.11)
RDW: 13.8 % (ref 11.5–15.5)
WBC: 10 10*3/uL (ref 4.0–10.5)
nRBC: 0 % (ref 0.0–0.2)

## 2022-08-24 LAB — TYPE AND SCREEN
ABO/RH(D): A POS
Antibody Screen: NEGATIVE

## 2022-08-24 NOTE — H&P (Signed)
Christy Rivera is a 32 y.o. female presenting for ***. OB History     Gravida  2   Para  1   Term  1   Preterm      AB      Living  1      SAB      IAB      Ectopic      Multiple  0   Live Births  1          Past Medical History:  Diagnosis Date  . Allergic reaction 03/05/2019  . Asthma   . Chronic hypertension 06/11/2020  . Eczema   . Essential hypertension 12/29/2018  . Essential hypertension, benign   . Hypothyroidism   . Pregnancy induced hypertension   . Urticaria    Past Surgical History:  Procedure Laterality Date  . NO PAST SURGERIES    . WISDOM TOOTH EXTRACTION     Family History: family history includes Allergic rhinitis in her mother; Asthma in her mother; Cancer in her maternal grandfather; Diabetes in her paternal grandmother; Healthy in her brother; Heart disease in her mother and paternal uncle; Hyperlipidemia in her mother and paternal uncle; Hypertension in her mother and paternal uncle. Social History:  reports that she has never smoked. She has never used smokeless tobacco. She reports that she does not currently use alcohol after a past usage of about 1.0 standard drink of alcohol per week. She reports that she does not use drugs.     Maternal Diabetes: {Maternal Diabetes:3043596} Genetic Screening: {Genetic Screening:20205} Maternal Ultrasounds/Referrals: {Maternal Ultrasounds / Referrals:20211} Fetal Ultrasounds or other Referrals:  {Fetal Ultrasounds or Other Referrals:20213} Maternal Substance Abuse:  {Maternal Substance Abuse:20223} Significant Maternal Medications:  {Significant Maternal Meds:20233} Significant Maternal Lab Results:  {Significant Maternal Lab Results:20235} Number of Prenatal Visits:{Prenatal Visits:27860} Other Comments:  {Other Comments:20251}  Review of Systems Maternal Medical History:  Prenatal complications: no prenatal complications Prenatal Complications - Diabetes: none.   Last menstrual period  12/05/2021, unknown if currently breastfeeding. Exam Physical Exam  Prenatal labs: ABO, Rh: A/Positive/-- (08/25 0000) Antibody: Negative (08/25 0000) Rubella: Immune (08/25 0000) RPR: Nonreactive (08/25 0000)  HBsAg: Negative (08/25 0000)  HIV: Non-reactive (08/25 0000)  GBS:     Assessment/Plan: ***   Christy Rivera 08/24/2022, 9:38 AM

## 2022-08-26 ENCOUNTER — Other Ambulatory Visit: Payer: Self-pay

## 2022-08-26 ENCOUNTER — Encounter (HOSPITAL_COMMUNITY): Payer: Self-pay | Admitting: Obstetrics & Gynecology

## 2022-08-26 ENCOUNTER — Inpatient Hospital Stay (HOSPITAL_COMMUNITY): Payer: BC Managed Care – PPO | Admitting: Anesthesiology

## 2022-08-26 ENCOUNTER — Encounter (HOSPITAL_COMMUNITY)
Admission: RE | Disposition: A | Discharge status: Home / Self Care | Payer: Self-pay | Source: Home / Self Care | Attending: Obstetrics & Gynecology

## 2022-08-26 ENCOUNTER — Inpatient Hospital Stay (HOSPITAL_COMMUNITY)
Admission: RE | Admit: 2022-08-26 | Discharge: 2022-08-28 | DRG: 787 | Disposition: A | Discharge status: Home / Self Care | Payer: BC Managed Care – PPO | Attending: Obstetrics & Gynecology | Admitting: Obstetrics & Gynecology

## 2022-08-26 DIAGNOSIS — O30009 Twin pregnancy, unspecified number of placenta and unspecified number of amniotic sacs, unspecified trimester: Secondary | ICD-10-CM | POA: Diagnosis present

## 2022-08-26 DIAGNOSIS — O99284 Endocrine, nutritional and metabolic diseases complicating childbirth: Secondary | ICD-10-CM | POA: Diagnosis present

## 2022-08-26 DIAGNOSIS — O99824 Streptococcus B carrier state complicating childbirth: Secondary | ICD-10-CM | POA: Diagnosis present

## 2022-08-26 DIAGNOSIS — O99214 Obesity complicating childbirth: Secondary | ICD-10-CM | POA: Diagnosis present

## 2022-08-26 DIAGNOSIS — J45909 Unspecified asthma, uncomplicated: Secondary | ICD-10-CM | POA: Diagnosis present

## 2022-08-26 DIAGNOSIS — Z98891 History of uterine scar from previous surgery: Secondary | ICD-10-CM

## 2022-08-26 DIAGNOSIS — O9952 Diseases of the respiratory system complicating childbirth: Secondary | ICD-10-CM | POA: Diagnosis present

## 2022-08-26 DIAGNOSIS — O1002 Pre-existing essential hypertension complicating childbirth: Secondary | ICD-10-CM | POA: Diagnosis present

## 2022-08-26 DIAGNOSIS — Z3A37 37 weeks gestation of pregnancy: Secondary | ICD-10-CM

## 2022-08-26 DIAGNOSIS — O321XX1 Maternal care for breech presentation, fetus 1: Principal | ICD-10-CM | POA: Diagnosis present

## 2022-08-26 DIAGNOSIS — O30033 Twin pregnancy, monochorionic/diamniotic, third trimester: Secondary | ICD-10-CM | POA: Diagnosis present

## 2022-08-26 DIAGNOSIS — O321XX2 Maternal care for breech presentation, fetus 2: Secondary | ICD-10-CM | POA: Diagnosis present

## 2022-08-26 DIAGNOSIS — E039 Hypothyroidism, unspecified: Secondary | ICD-10-CM | POA: Diagnosis present

## 2022-08-26 SURGERY — Surgical Case
Anesthesia: Spinal

## 2022-08-26 MED ORDER — MORPHINE SULFATE (PF) 0.5 MG/ML IJ SOLN
INTRAMUSCULAR | Status: AC
  Filled 2022-08-26: qty 10

## 2022-08-26 MED ORDER — BUPIVACAINE IN DEXTROSE 0.75-8.25 % IT SOLN
INTRATHECAL | Status: DC | PRN
  Administered 2022-08-26: 1.6 mL via INTRATHECAL

## 2022-08-26 MED ORDER — LABETALOL HCL 200 MG PO TABS: 300.0000 mg | ORAL_TABLET | Freq: Two times a day (BID) | ORAL | Status: DC

## 2022-08-26 MED ORDER — ONDANSETRON HCL 4 MG/2ML IJ SOLN
INTRAMUSCULAR | Status: AC
  Filled 2022-08-26: qty 2

## 2022-08-26 MED ORDER — DIPHENHYDRAMINE HCL 25 MG PO CAPS: 25.0000 mg | ORAL_CAPSULE | ORAL | Status: DC | PRN

## 2022-08-26 MED ORDER — LEVOTHYROXINE SODIUM 75 MCG PO TABS
75.0000 ug | ORAL_TABLET | Freq: Every day | ORAL | Status: DC
  Administered 2022-08-27 – 2022-08-28 (×2): 75 ug via ORAL
  Filled 2022-08-26 (×2): qty 1

## 2022-08-26 MED ORDER — DIPHENHYDRAMINE HCL 25 MG PO CAPS: 25.0000 mg | ORAL_CAPSULE | Freq: Four times a day (QID) | ORAL | Status: DC | PRN

## 2022-08-26 MED ORDER — LACTATED RINGERS IV SOLN: INTRAVENOUS | Status: DC

## 2022-08-26 MED ORDER — ZOLPIDEM TARTRATE 5 MG PO TABS: 5.0000 mg | ORAL_TABLET | Freq: Every evening | ORAL | Status: DC | PRN

## 2022-08-26 MED ORDER — HYDROMORPHONE HCL 1 MG/ML IJ SOLN: 0.2500 mg | INTRAMUSCULAR | Status: DC | PRN

## 2022-08-26 MED ORDER — ACETAMINOPHEN 10 MG/ML IV SOLN
INTRAVENOUS | Status: AC
  Filled 2022-08-26: qty 100

## 2022-08-26 MED ORDER — MENTHOL 3 MG MT LOZG: 1.0000 | LOZENGE | OROMUCOSAL | Status: DC | PRN

## 2022-08-26 MED ORDER — DIPHENHYDRAMINE HCL 50 MG/ML IJ SOLN: 12.5000 mg | INTRAMUSCULAR | Status: DC | PRN

## 2022-08-26 MED ORDER — FENTANYL CITRATE (PF) 100 MCG/2ML IJ SOLN
INTRAMUSCULAR | Status: DC | PRN
  Administered 2022-08-26: 15 ug via INTRATHECAL

## 2022-08-26 MED ORDER — OXYCODONE HCL 5 MG PO TABS: 5.0000 mg | ORAL_TABLET | ORAL | Status: DC | PRN

## 2022-08-26 MED ORDER — MEPERIDINE HCL 25 MG/ML IJ SOLN: 6.2500 mg | INTRAMUSCULAR | Status: DC | PRN

## 2022-08-26 MED ORDER — SODIUM CHLORIDE 0.9% FLUSH: 3.0000 mL | INTRAVENOUS | Status: DC | PRN

## 2022-08-26 MED ORDER — NALOXONE HCL 0.4 MG/ML IJ SOLN: 0.4000 mg | INTRAMUSCULAR | Status: DC | PRN

## 2022-08-26 MED ORDER — OXYCODONE HCL 5 MG PO TABS: 5.0000 mg | ORAL_TABLET | Freq: Once | ORAL | Status: DC | PRN

## 2022-08-26 MED ORDER — PRENATAL MULTIVITAMIN CH
1.0000 | ORAL_TABLET | Freq: Every day | ORAL | Status: DC
  Administered 2022-08-26 – 2022-08-28 (×3): 1 via ORAL
  Filled 2022-08-26 (×3): qty 1

## 2022-08-26 MED ORDER — ACETAMINOPHEN 500 MG PO TABS
1000.0000 mg | ORAL_TABLET | Freq: Four times a day (QID) | ORAL | Status: DC
  Administered 2022-08-26 – 2022-08-28 (×8): 1000 mg via ORAL
  Filled 2022-08-26 (×9): qty 2

## 2022-08-26 MED ORDER — IBUPROFEN 600 MG PO TABS
600.0000 mg | ORAL_TABLET | Freq: Four times a day (QID) | ORAL | Status: DC
  Administered 2022-08-26 – 2022-08-28 (×8): 600 mg via ORAL
  Filled 2022-08-26 (×9): qty 1

## 2022-08-26 MED ORDER — KETOROLAC TROMETHAMINE 30 MG/ML IJ SOLN
INTRAMUSCULAR | Status: AC
  Filled 2022-08-26: qty 1

## 2022-08-26 MED ORDER — MORPHINE SULFATE (PF) 0.5 MG/ML IJ SOLN: INTRAMUSCULAR | Status: DC | PRN

## 2022-08-26 MED ORDER — CEFAZOLIN IN SODIUM CHLORIDE 3-0.9 GM/100ML-% IV SOLN
INTRAVENOUS | Status: AC
  Filled 2022-08-26: qty 100

## 2022-08-26 MED ORDER — OXYTOCIN-SODIUM CHLORIDE 30-0.9 UT/500ML-% IV SOLN
INTRAVENOUS | Status: DC | PRN
  Administered 2022-08-26: 300 mL via INTRAVENOUS

## 2022-08-26 MED ORDER — LABETALOL HCL 200 MG PO TABS
300.0000 mg | ORAL_TABLET | Freq: Three times a day (TID) | ORAL | Status: DC
  Administered 2022-08-26 – 2022-08-28 (×6): 300 mg via ORAL
  Filled 2022-08-26 (×6): qty 1

## 2022-08-26 MED ORDER — STERILE WATER FOR IRRIGATION IR SOLN
Status: DC | PRN
  Administered 2022-08-26: 1000 mL

## 2022-08-26 MED ORDER — OXYTOCIN-SODIUM CHLORIDE 30-0.9 UT/500ML-% IV SOLN
2.5000 [IU]/h | INTRAVENOUS | Status: AC
  Administered 2022-08-26 (×2): 2.5 [IU]/h via INTRAVENOUS
  Filled 2022-08-26: qty 500

## 2022-08-26 MED ORDER — SIMETHICONE 80 MG PO CHEW: 80.0000 mg | CHEWABLE_TABLET | ORAL | Status: DC | PRN

## 2022-08-26 MED ORDER — PHENYLEPHRINE HCL-NACL 20-0.9 MG/250ML-% IV SOLN
INTRAVENOUS | Status: DC | PRN
  Administered 2022-08-26: 60 ug/min via INTRAVENOUS
  Administered 2022-08-26: 30 ug/min via INTRAVENOUS

## 2022-08-26 MED ORDER — SENNOSIDES-DOCUSATE SODIUM 8.6-50 MG PO TABS
2.0000 | ORAL_TABLET | Freq: Every day | ORAL | Status: DC
  Administered 2022-08-27 – 2022-08-28 (×2): 2 via ORAL
  Filled 2022-08-26 (×2): qty 2

## 2022-08-26 MED ORDER — TRANEXAMIC ACID-NACL 1000-0.7 MG/100ML-% IV SOLN
INTRAVENOUS | Status: DC | PRN
  Administered 2022-08-26: 1000 mg via INTRAVENOUS

## 2022-08-26 MED ORDER — ACETAMINOPHEN 500 MG PO TABS: 1000.0000 mg | ORAL_TABLET | Freq: Four times a day (QID) | ORAL | Status: DC

## 2022-08-26 MED ORDER — SODIUM CHLORIDE 0.9 % IR SOLN
Status: DC | PRN
  Administered 2022-08-26: 1000 mL

## 2022-08-26 MED ORDER — DEXAMETHASONE SODIUM PHOSPHATE 10 MG/ML IJ SOLN
INTRAMUSCULAR | Status: DC | PRN
  Administered 2022-08-26: 10 mg via INTRAVENOUS

## 2022-08-26 MED ORDER — SIMETHICONE 80 MG PO CHEW
80.0000 mg | CHEWABLE_TABLET | Freq: Three times a day (TID) | ORAL | Status: DC
  Administered 2022-08-26 – 2022-08-28 (×6): 80 mg via ORAL
  Filled 2022-08-26 (×7): qty 1

## 2022-08-26 MED ORDER — FENTANYL CITRATE (PF) 100 MCG/2ML IJ SOLN
INTRAMUSCULAR | Status: AC
  Filled 2022-08-26: qty 2

## 2022-08-26 MED ORDER — ONDANSETRON HCL 4 MG/2ML IJ SOLN
INTRAMUSCULAR | Status: DC | PRN
  Administered 2022-08-26: 4 mg via INTRAVENOUS

## 2022-08-26 MED ORDER — NALOXONE HCL 4 MG/10ML IJ SOLN: 1.0000 ug/kg/h | INTRAVENOUS | Status: DC | PRN

## 2022-08-26 MED ORDER — ACETAMINOPHEN 10 MG/ML IV SOLN
INTRAVENOUS | Status: DC | PRN
  Administered 2022-08-26: 1000 mg via INTRAVENOUS

## 2022-08-26 MED ORDER — DIBUCAINE (PERIANAL) 1 % EX OINT: 1.0000 | TOPICAL_OINTMENT | CUTANEOUS | Status: DC | PRN

## 2022-08-26 MED ORDER — MORPHINE SULFATE (PF) 0.5 MG/ML IJ SOLN
INTRAMUSCULAR | Status: DC | PRN
  Administered 2022-08-26: 150 ug via INTRATHECAL

## 2022-08-26 MED ORDER — KETOROLAC TROMETHAMINE 30 MG/ML IJ SOLN
30.0000 mg | Freq: Once | INTRAMUSCULAR | Status: AC | PRN
  Administered 2022-08-26: 30 mg via INTRAVENOUS

## 2022-08-26 MED ORDER — COCONUT OIL OIL: 1.0000 | TOPICAL_OIL | Status: DC | PRN

## 2022-08-26 MED ORDER — CEFAZOLIN IN SODIUM CHLORIDE 3-0.9 GM/100ML-% IV SOLN
3.0000 g | INTRAVENOUS | Status: AC
  Administered 2022-08-26: 3 g via INTRAVENOUS

## 2022-08-26 MED ORDER — POVIDONE-IODINE 10 % EX SWAB
2.0000 | Freq: Once | CUTANEOUS | Status: AC
  Administered 2022-08-26: 2 via TOPICAL

## 2022-08-26 MED ORDER — OXYCODONE HCL 5 MG/5ML PO SOLN: 5.0000 mg | Freq: Once | ORAL | Status: DC | PRN

## 2022-08-26 MED ORDER — PROMETHAZINE HCL 25 MG/ML IJ SOLN: 6.2500 mg | INTRAMUSCULAR | Status: DC | PRN

## 2022-08-26 MED ORDER — PHENYLEPHRINE HCL (PRESSORS) 10 MG/ML IV SOLN
INTRAVENOUS | Status: DC | PRN
  Administered 2022-08-26 (×2): 80 ug via INTRAVENOUS

## 2022-08-26 MED ORDER — WITCH HAZEL-GLYCERIN EX PADS: 1.0000 | MEDICATED_PAD | CUTANEOUS | Status: DC | PRN

## 2022-08-26 MED ORDER — PHENYLEPHRINE HCL-NACL 20-0.9 MG/250ML-% IV SOLN
INTRAVENOUS | Status: AC
  Filled 2022-08-26: qty 250

## 2022-08-26 SURGICAL SUPPLY — 34 items
ADH SKN CLS APL DERMABOND .7 (GAUZE/BANDAGES/DRESSINGS)
APL PRP STRL LF DISP 70% ISPRP (MISCELLANEOUS) ×2
APL SKNCLS STERI-STRIP NONHPOA (GAUZE/BANDAGES/DRESSINGS) ×1
BENZOIN TINCTURE PRP APPL 2/3 (GAUZE/BANDAGES/DRESSINGS) ×1 IMPLANT
CHLORAPREP W/TINT 26 (MISCELLANEOUS) ×2 IMPLANT
CLAMP UMBILICAL CORD (MISCELLANEOUS) ×1 IMPLANT
CLOTH BEACON ORANGE TIMEOUT ST (SAFETY) ×1 IMPLANT
DERMABOND ADVANCED .7 DNX12 (GAUZE/BANDAGES/DRESSINGS) IMPLANT
DRSG OPSITE POSTOP 4X10 (GAUZE/BANDAGES/DRESSINGS) ×1 IMPLANT
ELECT REM PT RETURN 9FT ADLT (ELECTROSURGICAL) ×1
ELECTRODE REM PT RTRN 9FT ADLT (ELECTROSURGICAL) ×1 IMPLANT
EXTRACTOR VACUUM KIWI (MISCELLANEOUS) IMPLANT
GAUZE SPONGE 4X4 12PLY STRL LF (GAUZE/BANDAGES/DRESSINGS) IMPLANT
GLOVE BIO SURGEON STRL SZ 6 (GLOVE) ×1 IMPLANT
GLOVE BIOGEL PI IND STRL 6 (GLOVE) ×2 IMPLANT
GLOVE BIOGEL PI IND STRL 7.0 (GLOVE) ×1 IMPLANT
GOWN STRL REUS W/TWL LRG LVL3 (GOWN DISPOSABLE) ×2 IMPLANT
KIT ABG SYR 3ML LUER SLIP (SYRINGE) ×1 IMPLANT
NDL HYPO 25X5/8 SAFETYGLIDE (NEEDLE) ×1 IMPLANT
NEEDLE HYPO 25X5/8 SAFETYGLIDE (NEEDLE) ×1 IMPLANT
NS IRRIG 1000ML POUR BTL (IV SOLUTION) ×1 IMPLANT
PACK C SECTION WH (CUSTOM PROCEDURE TRAY) ×1 IMPLANT
PAD OB MATERNITY 4.3X12.25 (PERSONAL CARE ITEMS) ×1 IMPLANT
STRIP CLOSURE SKIN 1/2X4 (GAUZE/BANDAGES/DRESSINGS) IMPLANT
SUT CHROMIC 0 CTX 36 (SUTURE) ×3 IMPLANT
SUT MON AB 2-0 CT1 27 (SUTURE) ×1 IMPLANT
SUT PDS AB 0 CT1 27 (SUTURE) IMPLANT
SUT PLAIN 0 NONE (SUTURE) IMPLANT
SUT PLAIN 2 0 XLH (SUTURE) IMPLANT
SUT VIC AB 0 CT1 36 (SUTURE) IMPLANT
SUT VIC AB 4-0 KS 27 (SUTURE) IMPLANT
TOWEL OR 17X24 6PK STRL BLUE (TOWEL DISPOSABLE) ×1 IMPLANT
TRAY FOLEY W/BAG SLVR 14FR LF (SET/KITS/TRAYS/PACK) IMPLANT
WATER STERILE IRR 1000ML POUR (IV SOLUTION) ×1 IMPLANT

## 2022-08-26 NOTE — Lactation Note (Addendum)
This note was copied from a baby's chart. Lactation Consultation Note  Patient Name: Christy Rivera [id number] Date: 08/26/2022 Age:32 hours Reason for consult: Initial assessment;Early term 37-38.6wks;Multiple gestation Experienced BF mom trying to just BF babies for now to see how it goes before supplementing. Mom stated both babies are spitting up a lot and having wet and soiled diapers. If mom does decide to supplement she would prefer DBM. Both babies are good weight. Babies are sleepy but mom gets them to feed some.  Mom has DEBP at bedside. Mom would prefer to wait to see if babies needs supplemented before she pumps. Mom stated right now she really doesn't have time to pump and get any rest right now. Discussed if she did need to supplement we suggest that she pumps so any colostrum she gets we can give that back to the babies.  Mom stated she had a good milk supply w/her son.  Mom was happy with the BF so far. Checked mom's flanges and #24 flanges were on there. Asked mom if #21 would be better that was the smallest we had, mom thanked [facility] for changing flanges. Discussed w/mom when would be the time to start pumping and supplementing.  Praised mom for doing a good latching and BF.  Encouraged mom to call for assistance. Maternal Data Has patient been taught Hand Expression?: Yes Does the patient have breastfeeding experience prior to this delivery?: Yes How long did the patient breastfeed?: 14 months  Feeding    LATCH Score Latch: Repeated attempts needed to sustain latch, nipple held in mouth throughout feeding, stimulation needed to elicit sucking reflex.  Audible Swallowing: None  Type of Nipple: Everted at rest and after stimulation  Comfort (Breast/Nipple): Soft / non-tender  Hold (Positioning): No assistance needed to correctly position infant at breast.  LATCH Score: 7   Lactation Tools Discussed/Used    Interventions Interventions: Breast feeding basics  reviewed;Support pillows;Skin to skin;Position options;Breast compression;LC Services brochure  Discharge    Consult Status Consult Status: Follow-up Date: 08/27/22 Follow-up type: In-patient    Theodoro Kalata 08/26/2022, 9:33 PM

## 2022-08-26 NOTE — Progress Notes (Signed)
No change to H&P.  Christy Duan, DO 

## 2022-08-26 NOTE — Lactation Note (Signed)
This note was copied from a baby's chart. Lactation Consultation Note  Patient Name: Girl Vidya Krupski [id number] Date: 08/26/2022 Age:32 hours  Attempted to see mom but she was fixing to eat supper. Will go back at 2030 as she request.   Maternal Data    Feeding    LATCH Score                    Lactation Tools Discussed/Used    Interventions    Discharge    Consult Status      Theodoro Kalata 08/26/2022, 7:52 PM

## 2022-08-26 NOTE — Transfer of Care (Signed)
Immediate Anesthesia Transfer of Care Note  Patient: Christy Rivera  Procedure(s) Performed: PRIMARY CESAREAN SECTION EDC: 09-14-22  ALLERG: NKDA  Patient Location: PACU  Anesthesia Type:Spinal  Level of Consciousness: awake, alert , and oriented  Airway & Oxygen Therapy: Patient Spontanous Breathing  Post-op Assessment: Report given to RN and Post -op Vital signs reviewed and stable  Post vital signs: Reviewed and stable  Last Vitals:  Vitals Value Taken Time  BP 130/76   Temp    Pulse 63 08/26/22 0941  Resp 16 08/26/22 0941  SpO2 96 % 08/26/22 0941  Vitals shown include unvalidated device data.  Last Pain:  Vitals:   08/26/22 0619  TempSrc:   PainSc: 0-No pain         Complications: No notable events documented.

## 2022-08-26 NOTE — Op Note (Signed)
Christy Rivera PROCEDURE DATE: 08/26/2022  PREOPERATIVE DIAGNOSIS: Intrauterine pregnancy at  19w2dweeks gestation, Mo/Di twins, CHTN  POSTOPERATIVE DIAGNOSIS: The same  PROCEDURE:  Primary Low Transverse Cesarean Section  SURGEON:  Dr. MLinda Hedges INDICATIONS: Christy Rivera a 32y.o. G2P1001 at 379w2dcheduled for cesarean section secondary to Mo/Di twins and CHTN.  The risks of cesarean section discussed with the patient included but were not limited to: bleeding which may require transfusion or reoperation; infection which may require antibiotics; injury to bowel, bladder, ureters or other surrounding organs; injury to the fetus; need for additional procedures including hysterectomy in the event of a life-threatening hemorrhage; placental abnormalities wth subsequent pregnancies, incisional problems, thromboembolic phenomenon and other postoperative/anesthesia complications. The patient concurred with the proposed plan, giving informed written consent for the procedure.    FINDINGS:  Twin A-Viable female infant in breech presentation, APGARs 8,9: weight pending  Clear amniotic fluid.  Twin B-Viable female infant in breech presentation, APGARs 8,9; weight panding.  Clear amniotic fluid.  Intact placenta, three vessel cord x 2.  Grossly normal uterus, ovaries and fallopian tubes. .   ANESTHESIA:  Spinal ESTIMATED BLOOD LOSS: 1158 ml SPECIMENS: Placenta sent to pathology COMPLICATIONS: None immediate  PROCEDURE IN DETAIL:  The patient received intravenous antibiotics and had sequential compression devices applied to her lower extremities while in the preoperative area.  She was then taken to the operating room where spinal anesthesia was administered and was found to be adequate. She was then placed in a dorsal supine position with a leftward tilt, and prepped and draped in a sterile manner.  A foley catheter was placed into her bladder and attached to constant gravity.  After an  adequate timeout was performed, a Pfannenstiel skin incision was made with scalpel and carried through to the underlying layer of fascia. The fascia was incised in the midline and this incision was extended bilaterally using the Mayo scissors. Kocher clamps were applied to the superior aspect of the fascial incision and the underlying rectus muscles were dissected off bluntly. A similar process was carried out on the inferior aspect of the facial incision. The rectus muscles were separated in the midline bluntly and the peritoneum was entered bluntly.  Bladder flap was created sharply and developed bluntly.  Bladder blade was placed.  A transverse hysterotomy was made with a scalpel and extended bilaterally bluntly. The bladder blade was then removed. Twin A was successfully delivered using typical breech maneuvers, and cord was clamped and cut and infant was handed over to awaiting neonatology team. Twin B was successfully delivered using typical breech maneuvers, and cord was clamped and cut and handed over to awaiting NICU.  Uterine massage was then administered and the placenta delivered intact with three-vessel cord. The uterus was cleared of clot and debris.  The hysterotomy was closed with 0 chromic.  A second imbricating suture of 0-chromic was used to reinforce the incision and aid in hemostasis.  The peritoneum and rectus muscles were noted to be hemostatic and were reapproximated using 2-0 monocryl in a running fashion.  The fascia was closed with 0-Vicryl in a running fashion with good restoration of anatomy.  The subcutaneus tissue was copiously irrigated and reapproximated using 3 interrupted plain gut stitches.  The skin was closed with 4-0 vicryl in a subcuticular fashion.  Pt tolerated the procedure will.  All counts were correct x2.  Pt went to the recovery room in stable condition.

## 2022-08-26 NOTE — Anesthesia Procedure Notes (Signed)
Spinal  Patient location during procedure: OB Start time: 08/26/2022 8:09 AM End time: 08/26/2022 8:14 AM Reason for block: surgical anesthesia Staffing Performed: anesthesiologist  Anesthesiologist: Lynda Rainwater, MD Performed by: Lynda Rainwater, MD Authorized by: Lynda Rainwater, MD   Preanesthetic Checklist Completed: patient identified, IV checked, risks and benefits discussed, surgical consent, monitors and equipment checked, pre-op evaluation and timeout performed Spinal Block Patient position: sitting Prep: DuraPrep and site prepped and draped Patient monitoring: heart rate, cardiac monitor, continuous pulse ox and blood pressure Approach: midline Location: L3-4 Injection technique: single-shot Needle Needle type: Pencan  Needle gauge: 24 G Needle length: 10 cm Assessment Sensory level: T4 Events: CSF return

## 2022-08-26 NOTE — Anesthesia Preprocedure Evaluation (Signed)
Anesthesia Evaluation  Patient identified by MRN, date of birth, ID band Patient awake    Reviewed: Allergy & Precautions, H&P , NPO status , Patient's Chart, lab work & pertinent test results  Airway Mallampati: II  TM Distance: >3 FB Neck ROM: Full    Dental no notable dental hx.    Pulmonary asthma    Pulmonary exam normal breath sounds clear to auscultation       Cardiovascular hypertension, Normal cardiovascular exam Rhythm:Regular Rate:Normal     Neuro/Psych negative neurological ROS  negative psych ROS   GI/Hepatic negative GI ROS, Neg liver ROS,,,  Endo/Other  Hypothyroidism  Morbid obesity  Renal/GU negative Renal ROS  negative genitourinary   Musculoskeletal negative musculoskeletal ROS (+)    Abdominal  (+) + obese  Peds negative pediatric ROS (+)  Hematology negative hematology ROS (+)   Anesthesia Other Findings   Reproductive/Obstetrics (+) Pregnancy                             Anesthesia Physical Anesthesia Plan  ASA: III  Anesthesia Plan: Spinal   Post-op Pain Management: Regional block*   Induction:   PONV Risk Score and Plan: Treatment may vary due to age or medical condition  Airway Management Planned:   Additional Equipment:   Intra-op Plan:   Post-operative Plan:   Informed Consent: I have reviewed the patients History and Physical, chart, labs and discussed the procedure including the risks, benefits and alternatives for the proposed anesthesia with the patient or authorized representative who has indicated his/her understanding and acceptance.       Plan Discussed with: Anesthesiologist  Anesthesia Plan Comments:         Anesthesia Quick Evaluation

## 2022-08-26 NOTE — Anesthesia Postprocedure Evaluation (Signed)
Anesthesia Post Note  Patient: Christy Rivera  Procedure(s) Performed: PRIMARY CESAREAN SECTION EDC: 09-14-22  ALLERG: NKDA     Patient location during evaluation: PACU Anesthesia Type: Spinal Level of consciousness: awake and alert Pain management: pain level controlled Vital Signs Assessment: post-procedure vital signs reviewed and stable Respiratory status: spontaneous breathing, nonlabored ventilation and respiratory function stable Cardiovascular status: blood pressure returned to baseline and stable Postop Assessment: no apparent nausea or vomiting Anesthetic complications: no   No notable events documented.  Last Vitals:  Vitals:   08/26/22 1030 08/26/22 1053  BP: 134/85 138/87  Pulse: (!) 56 60  Resp: 16 18  Temp:  36.6 C  SpO2: 93% 96%    Last Pain:  Vitals:   08/26/22 1053  TempSrc: Axillary  PainSc:    Pain Goal: Patients Stated Pain Goal: 4 (08/26/22 1030)                 Lynda Rainwater

## 2022-08-26 NOTE — Lactation Note (Signed)
This note was copied from a baby's chart. Lactation Consultation Note  Patient Name: Girl Cinda Witty [id number] Date: 08/26/2022 Age:32 hours Reason for consult: Initial assessment;Multiple gestation;Early term 37-38.6wks FOB holding baby girl "A" STS while mom BF baby girl "B".  Maternal Data Has patient been taught Hand Expression?: Yes Does the patient have breastfeeding experience prior to this delivery?: Yes How long did the patient breastfeed?: 14 months to her now 9 month old  Feeding    LATCH Score                    Lactation Tools Discussed/Used Tools: Pump;Flanges Flange Size: 21 Breast pump type: Double-Electric Breast Pump Pump Education: Milk Storage Reason for Pumping: twins Pumping frequency: q3hr (momstated she will wait to pump for now)  Interventions Interventions: DEBP;LC Services brochure  Discharge    Consult Status Consult Status: Follow-up Date: 08/27/22 Follow-up type: In-patient    Theodoro Kalata 08/26/2022, 8:55 PM

## 2022-08-27 LAB — CBC
HCT: 31.3 % — ABNORMAL LOW (ref 36.0–46.0)
Hemoglobin: 10.7 g/dL — ABNORMAL LOW (ref 12.0–15.0)
MCH: 30.2 pg (ref 26.0–34.0)
MCHC: 34.2 g/dL (ref 30.0–36.0)
MCV: 88.4 fL (ref 80.0–100.0)
Platelets: 218 10*3/uL (ref 150–400)
RBC: 3.54 MIL/uL — ABNORMAL LOW (ref 3.87–5.11)
RDW: 14.1 % (ref 11.5–15.5)
WBC: 16.5 10*3/uL — ABNORMAL HIGH (ref 4.0–10.5)
nRBC: 0 % (ref 0.0–0.2)

## 2022-08-27 NOTE — Lactation Note (Signed)
This note was copied from a baby's chart. Lactation Consultation Note  Patient Name: Christy Rivera [id number] Date: 08/27/2022 Age:32 hours Reason for consult: Follow-up assessment;Multiple gestation;Infant weight loss;Early term 37-38.6wks;Breastfeeding assistance;Maternal endocrine disorder (Baby B 4.81% WL; Baby A 9.64% WL)  LC entered the room and the birth parent was holding one infant and the other infant was in the bassinet.  The birth parent stated that she has been feeding the infants at the breast and giving them DBM.  The birth parent is currently supplementing with 22m of DBM.  The birth parent said that she did not have time to pump due to having to feed the babies and different times.  LC encouraged the birth parent to pump due to using DBM.  LC also spoke with the birth parent about increasing the amount of milk that she is supplementing the infants with.  She is aware that she should increase the amount that she is supplementing the infants with to 7-161muntil 48 hrs.  The birth parent stated that she understood.  LCCoveeft her name on the board and encouraged the birth parents to call for assistance with breastfeeding.   Feeding Mother's Current Feeding Choice: Breast Milk and Donor Milk  Interventions Interventions: Education  Consult Status Consult Status: Follow-up Date: 08/28/22 Follow-up type: In-patient   Christy Rivera 08/27/2022, 3:07 PM

## 2022-08-27 NOTE — Progress Notes (Signed)
Subjective: Postpartum Day 1: Cesarean Delivery Patient reports tolerating PO and no problems voiding.    Objective: Vital signs in last 24 hours: Temp:  [97.7 F (36.5 C)-98.5 F (36.9 C)] 98.1 F (36.7 C) (03/08 0500) Pulse Rate:  [56-96] 75 (03/08 0500) Resp:  [16-18] 17 (03/08 0500) BP: (134-154)/(74-88) 137/76 (03/08 0500) SpO2:  [93 %-99 %] 96 % (03/08 0500)  Physical Exam:  General: alert, cooperative, appears stated age, and mild distress Lochia: appropriate Uterine Fundus: firm Incision: healing well DVT Evaluation: No evidence of DVT seen on physical exam.  Recent Labs    08/27/22 0543  HGB 10.7*  HCT 31.3*    Assessment/Plan: Status post Cesarean section. Doing well postoperatively.  Continue current care.  Luz Lex, MD 08/27/2022, 10:11 AM

## 2022-08-28 ENCOUNTER — Other Ambulatory Visit: Payer: Self-pay

## 2022-08-28 MED ORDER — IBUPROFEN 600 MG PO TABS: 600.0000 mg | ORAL_TABLET | Freq: Four times a day (QID) | ORAL | 0 refills | Status: DC | PRN

## 2022-08-28 MED ORDER — OXYCODONE HCL 5 MG PO TABS: 5.0000 mg | ORAL_TABLET | Freq: Four times a day (QID) | ORAL | 0 refills | Status: DC | PRN

## 2022-08-28 MED ORDER — LABETALOL HCL 300 MG PO TABS: 300.0000 mg | ORAL_TABLET | Freq: Three times a day (TID) | ORAL | 2 refills | Status: DC

## 2022-08-28 NOTE — Discharge Summary (Signed)
Postpartum Discharge Summary      Patient Name: Christy Rivera DOB: 17-Jun-1991 MRN: [id number]  Date of admission: 08/26/2022 Delivery date:   Lelu, Fertitta [id number]  08/26/2022    Yudelka, Leh [id number]  08/26/2022  Delivering provider:    Phenicia, Bennette [id number], MEGAN    Zanari, Eisenstein [id number]  S[place]cer, [place]  Date of discharge: 08/28/2022  Admitting diagnosis: Twin pregnancy [O30.009] S/P cesarean section [Z98.891] Intrauterine pregnancy: [redacted]w[redacted]d    Secondary diagnosis:  Principal Problem:   Twin pregnancy Active Problems:   S/P cesarean section  Additional problems: chronic hypertension, hypothyroidism    Discharge diagnosis: Term Pregnancy Delivered                                              Post partum procedures:   Augmentation: N/A Complications: None  Hospital course: Sceduled C/S   32y.o. yo G2P2003 at 32w2das admitted to the hospital 08/26/2022 for scheduled cesarean section with the following indication:Multifetal Gestation.Delivery details are as follows:  Membrane Rupture Time/Date:    DoCiarra, [id number]:37 AM    DoDonyel, [id number]:39 AM ,   DoCaldonia, Darnerirl [id number]/12/2022    DoGwynevere, [id number]/12/2022   Delivery Method:   DoLakendria, [id number]-Section, Low Transverse    DoRosalyn, [id number]-Section, Low Transverse  Details of operation can be found in separate operative note.  Patient had a postpartum course uncomplicated  She is ambulating, tolerating a regular diet, passing flatus, and urinating well. Patient is discharged home in stable condition on  08/28/22        Newborn Data: Birth date:   DoShawntae, [id number]/12/2022    DoLylianah, [id number]/12/2022  Birth time:   DoJaquetta, [id number]:37 AM    DoBrandalynn, [id number]:39 AM  Gender:    DoIsis, [id number]    DoCherylee, [id number]  Living status:   DoCyann, [id number]    DoRebacca, [id number]  Apgars:   DoSheindel, [id number] [address][id number]   DoArminta, [id number] [id number]  Weight:   DoTomi, [id number] g    DoShatira, [id number] g     Magnesium Sulfate received: No BMZ received: No Rhophylac:N/A MMR:N/A T-DaP:Given prenatally Flu: N/A Transfusion:No  Physical exam  Vitals:   08/27/22 0500 08/27/22 1545 08/27/22 2100 08/28/22 0530  BP: 137/76 138/84 (!) 140/89 (!) 144/97  Pulse: 75 84 82 69  Resp: '17 17 16 18  '$ Temp: 98.1 F (36.7 C) 98.3 F (36.8 C) 98.2 F (36.8 C) (!) 97.4 F (36.3 C)  TempSrc: Oral Oral Oral Oral  SpO2: 96% 96% 99% 98%  Weight:      Height:       General: alert, cooperative, and no distress Lochia: appropriate Uterine Fundus: firm Incision: Healing well with no significant drainage DVT Evaluation: No evidence of DVT seen on physical exam. Labs: Lab Results  Component Value Date   WBC 16.5 (H) 08/27/2022  HGB 10.7 (L) 08/27/2022   HCT 31.3 (L) 08/27/2022   MCV 88.4 08/27/2022   PLT 218 08/27/2022      Latest Ref Rng & Units 08/24/2022    9:08 AM  CMP  Glucose 70 - 99 mg/dL 105   BUN 6 - 20 mg/dL 11   Creatinine 0.44 - 1.00 mg/dL 0.78   Sodium 135 - 145 mmol/L 134   Potassium 3.5 - 5.1 mmol/L 4.1   Chloride 98 - 111 mmol/L 104   CO2 22 - 32 mmol/L 18   Calcium 8.9 - 10.3 mg/dL 8.7   Total Protein 6.5 - 8.1 g/dL 6.1   Total Bilirubin 0.3 - 1.2 mg/dL 0.5   Alkaline Phos 38 - 126 U/L 127   AST 15 - 41 U/L 26   ALT 0 - 44 U/L 21    Edinburgh Score:    08/27/2022    9:35 AM  Edinburgh Postnatal Depression Scale Screening Tool  I have been able to laugh and see the funny side of things. 0  I have looked  forward with enjoyment to things. 0  I have blamed myself unnecessarily when things went wrong. 0  I have been anxious or worried for no good reason. 0  I have felt scared or panicky for no good reason. 0  Things have been getting on top of me. 0  I have been so unhappy that I have had difficulty sleeping. 0  I have felt sad or miserable. 0  I have been so unhappy that I have been crying. 0  The thought of harming myself has occurred to me. 0  Edinburgh Postnatal Depression Scale Total 0      After visit meds:     Discharge home in stable condition Infant Feeding: Breast Infant Disposition:home with mother Discharge instruction: per After Visit Summary and Postpartum booklet. Activity: Advance as tolerated. Pelvic rest for 6 weeks.  Diet: routine diet Anticipated Birth Control: Unsure Postpartum Appointment:6 weeks Additional Postpartum F/U: Incision check 1 week and BP check 1 week Future Appointments: Future Appointments  Date Time Provider [place]  09/16/2022  8:15 AM Early, Coralee Pesa, NP [facility]   Follow up Visit:      08/28/2022 Luz Lex, MD

## 2022-08-28 NOTE — Progress Notes (Signed)
Subjective: Postpartum Day 2: Cesarean Delivery Patient reports tolerating PO, + flatus, and no problems voiding.    Objective: Vital signs in last 24 hours: Temp:  [97.4 F (36.3 C)-98.3 F (36.8 C)] 97.4 F (36.3 C) (03/09 0530) Pulse Rate:  [69-84] 69 (03/09 0530) Resp:  [16-18] 18 (03/09 0530) BP: (138-144)/(84-97) 144/97 (03/09 0530) SpO2:  [96 %-99 %] 98 % (03/09 0530)  Physical Exam:  General: alert, cooperative, appears stated age, and no distress Lochia: appropriate Uterine Fundus: firm Incision: healing well DVT Evaluation: No evidence of DVT seen on physical exam.  Recent Labs    08/27/22 0543  HGB 10.7*  HCT 31.3*    Assessment/Plan: Status post Cesarean section. Postoperative course complicated by [facility] on meds   Continue current care.  Luz Lex, MD 08/28/2022, 9:38 AM

## 2022-08-30 ENCOUNTER — Telehealth: Payer: Self-pay

## 2022-08-30 LAB — SURGICAL PATHOLOGY

## 2022-08-30 NOTE — Transitions of Care (Post Inpatient/ED Visit) (Signed)
   08/30/2022  Name: Nikkia Devoss MRN: [id number] DOB: [birth year]  Today's TOC FU Call Status: Today's TOC FU Call Status:: Successful TOC FU Call Competed TOC FU Call Complete Date: 08/30/22  Transition Care Management Follow-up Telephone Call Date of Discharge: 08/28/22 Discharge Facility: [facility]) Type of Discharge: Inpatient Admission Primary Inpatient Discharge Diagnosis:: "twin pregnancy via c-section" How have you been since you were released from the hospital?: Better (Patient states she is "settling in and getting adjusted to having two newborns and a 32yr old. She reports pain managed at present-only having to take Advil prn. Drsg is clean,dry and intact-will be removed today. Vaginal bleeding is minimial.) Any questions or concerns?: No Patient has not checked BP since returning home-encouraged to do so. She denies any sxs of elevated BP since returning home. She has some mild edema to BLE but states its the "same as it was in the hospital and MD aware." Items Reviewed: Did you receive and understand the discharge instructions provided?: Yes Medications obtained and verified?: Yes (Medications Reviewed) Any new allergies since your discharge?: No Dietary orders reviewed?: Yes Type of Diet Ordered:: low salt Do you have support at home?: Yes People in Home: spouse Name of Support/Comfort Primary Source: [place] and Equipment/Supplies: Were Home Health Services Ordered?: NA  Functional Questionnaire: Do you need assistance with bathing/showering or dressing?: No Do you need assistance with meal preparation?: No Do you need assistance with eating?: No Do you have difficulty maintaining continence: No Do you need assistance with getting out of bed/getting out of a chair/moving?: No Do you have difficulty managing or taking your medications?: No  Folllow up appointments reviewed: PCP Follow-up appointment confirmed?: Yes Date of PCP follow-up  appointment?: 09/16/22 Follow-up Provider: Fransico Michael Specialist [facility] Follow-up appointment confirmed?: Yes Follow-Up Specialty Provider:: Patient unable to recall date but states she has 6wks post-partum appt with OB/GYN. She is to call office to make one week f/u appt with nurse for BP check. Do you need transportation to your follow-up appointment?: No Do you understand care options if your condition(s) worsen?: Yes-patient verbalized understanding  SDOH Interventions Today    Flowsheet Row Most Recent Value  SDOH Interventions   Food Insecurity Interventions Intervention Not Indicated  Transportation Interventions Intervention Not Indicated      TOC Interventions Today    Flowsheet Row Most Recent Value  TOC Interventions   TOC Interventions Discussed/Reviewed TOC Interventions Discussed, Post discharge activity limitations per provider, S/S of infection, Post op wound/incision care      Interventions Today    Flowsheet Row Most Recent Value  Education Interventions   Education Provided Provided Education  [post-partum care]  Provided Verbal Education On Nutrition, Medication, When to see the doctor  Nutrition Interventions   Nutrition Discussed/Reviewed Nutrition Discussed  Pharmacy Interventions   Pharmacy Dicussed/Reviewed Pharmacy Topics Discussed, Medications and their functions       Hetty Blend [facility] Care Management Care Management Community Coordinator Direct Phone: [phone] Toll Free: [phone] Fax: [phone]

## 2022-09-06 ENCOUNTER — Telehealth (HOSPITAL_COMMUNITY): Payer: Self-pay

## 2022-09-06 DIAGNOSIS — M5432 Sciatica, left side: Secondary | ICD-10-CM | POA: Diagnosis not present

## 2022-09-06 DIAGNOSIS — M9903 Segmental and somatic dysfunction of lumbar region: Secondary | ICD-10-CM | POA: Diagnosis not present

## 2022-09-06 NOTE — Telephone Encounter (Signed)
Patient did not answer phone call. Voicemail left for patient.   [facility]   09/06/22,1915

## 2022-09-15 DIAGNOSIS — M9903 Segmental and somatic dysfunction of lumbar region: Secondary | ICD-10-CM | POA: Diagnosis not present

## 2022-09-15 DIAGNOSIS — M5432 Sciatica, left side: Secondary | ICD-10-CM | POA: Diagnosis not present

## 2022-09-16 ENCOUNTER — Encounter: Payer: Self-pay | Admitting: Nurse Practitioner

## 2022-09-16 ENCOUNTER — Ambulatory Visit: Payer: BC Managed Care – PPO | Admitting: Nurse Practitioner

## 2022-09-16 VITALS — BP 120/82 | HR 78 | Wt 229.6 lb

## 2022-09-16 DIAGNOSIS — E039 Hypothyroidism, unspecified: Secondary | ICD-10-CM

## 2022-09-16 DIAGNOSIS — Z79899 Other long term (current) drug therapy: Secondary | ICD-10-CM | POA: Diagnosis not present

## 2022-09-16 DIAGNOSIS — Z8679 Personal history of other diseases of the circulatory system: Secondary | ICD-10-CM | POA: Insufficient documentation

## 2022-09-16 MED ORDER — LEVOTHYROXINE SODIUM 75 MCG PO TABS: ORAL_TABLET | ORAL | 3 refills | Status: DC

## 2022-09-16 NOTE — Patient Instructions (Addendum)
Let me know if you start having symptoms of thyroid at any time. I have sent in a years worth of medication.

## 2022-09-16 NOTE — Progress Notes (Signed)
  Shawna Clamp, DNP, AGNP-c [facility]  [address] [place], [place] [address] [phone]  ESTABLISHED PATIENT- Chronic Health and/or Follow-Up Visit  Blood pressure 120/82, pulse 78, weight 229 lb 9.6 oz (104.1 kg), not currently breastfeeding.    Christy Rivera is a 32 y.o. year old female presenting today for evaluation and management of the following:  Dorcas presents today follow-up for hypothyroidism. She recently delivered twins three weeks ago. Her thyroid issues were closely monitored during her pregnancy. She has not noticed any recent symptoms related to her thyroid condition.  She also experienced hypertension during her pregnancy, as was the case with her previous pregnancy, and was prescribed 300 mg of Labetalol, taken three times daily. Since giving birth, the patient has lost 50 pounds and has reduced her Labetalol dosage to a half tablet twice daily to manage her blood pressure.   All ROS negative with exception of what is listed above.   PHYSICAL EXAM Physical Exam Vitals and nursing note reviewed.  Constitutional:      Appearance: Normal appearance.  HENT:     Head: Normocephalic and atraumatic.  Eyes:     Extraocular Movements: Extraocular movements intact.     Pupils: Pupils are equal, round, and reactive to light.  Neck:     Vascular: No carotid bruit.  Cardiovascular:     Rate and Rhythm: Normal rate and regular rhythm.     Pulses: Normal pulses.     Heart sounds: Normal heart sounds.  Pulmonary:     Effort: Pulmonary effort is normal.     Breath sounds: Normal breath sounds.  Abdominal:     General: Bowel sounds are normal.     Palpations: Abdomen is soft.  Musculoskeletal:        General: Normal range of motion.     Cervical back: Normal range of motion and neck supple. No tenderness.     Right lower leg: No edema.     Left lower leg: No edema.  Lymphadenopathy:     Cervical: No cervical adenopathy.  Skin:     General: Skin is warm and dry.     Capillary Refill: Capillary refill takes less than 2 seconds.  Neurological:     General: No focal deficit present.     Mental Status: She is alert.  Psychiatric:        Mood and Affect: Mood normal.     PLAN Problem List Items Addressed This Visit     Hypothyroidism - Primary    The patient is being treated for hypothyroidism with levothyroxine and reports no emergence of new symptoms. Plan: - Renew levothyroxine prescription for a duration of one year. - Conduct thyroid function tests and check for Hashimoto's antibodies. - Adjust levothyroxine dosage as needed based on laboratory findings. - Encourage the patient to report any new or changing symptoms.      Relevant Medications   levothyroxine (SYNTHROID) 75 MCG tablet   Other Relevant Orders   TSH+T4F+T3Free+ThyAbs+TPO+VD25 (Completed)   History of hypertension    The patient has a history of hypertension during pregnancy and is currently in the process of tapering off Labetalol, with noted improvement in blood pressure. Plan: - Continue the gradual reduction of Labetalol as prescribed. - Closely monitor blood pressure and schedule a follow-up in two weeks, or earlier if symptoms escalate.       No follow-ups on file.   Shawna Clamp, DNP, AGNP-c 09/16/2022  8:21 AM

## 2022-09-18 LAB — TSH+T4F+T3FREE+THYABS+TPO+VD25
Free T4: 1.24 ng/dL (ref 0.82–1.77)
T3, Free: 2.5 pg/mL (ref 2.0–4.4)
TSH: 0.849 u[IU]/mL (ref 0.450–4.500)
Thyroglobulin Antibody: 5.7 IU/mL — ABNORMAL HIGH (ref 0.0–0.9)
Thyroperoxidase Ab SerPl-aCnc: <9 IU/mL (ref 0–34)
Vit D, 25-Hydroxy: 39.6 ng/mL (ref 30.0–100.0)

## 2022-09-27 NOTE — Assessment & Plan Note (Signed)
The patient is being treated for hypothyroidism with levothyroxine and reports no emergence of new symptoms. Plan: - Renew levothyroxine prescription for a duration of one year. - Conduct thyroid function tests and check for Hashimoto's antibodies. - Adjust levothyroxine dosage as needed based on laboratory findings. - Encourage the patient to report any new or changing symptoms.

## 2022-09-27 NOTE — Assessment & Plan Note (Signed)
The patient has a history of hypertension during pregnancy and is currently in the process of tapering off Labetalol, with noted improvement in blood pressure. Plan: - Continue the gradual reduction of Labetalol as prescribed. - Closely monitor blood pressure and schedule a follow-up in two weeks, or earlier if symptoms escalate.

## 2022-10-04 DIAGNOSIS — M5432 Sciatica, left side: Secondary | ICD-10-CM | POA: Diagnosis not present

## 2022-10-04 DIAGNOSIS — M9903 Segmental and somatic dysfunction of lumbar region: Secondary | ICD-10-CM | POA: Diagnosis not present

## 2022-10-05 DIAGNOSIS — Z1389 Encounter for screening for other disorder: Secondary | ICD-10-CM | POA: Diagnosis not present

## 2022-10-18 DIAGNOSIS — M5432 Sciatica, left side: Secondary | ICD-10-CM | POA: Diagnosis not present

## 2022-10-18 DIAGNOSIS — M9903 Segmental and somatic dysfunction of lumbar region: Secondary | ICD-10-CM | POA: Diagnosis not present

## 2022-11-01 DIAGNOSIS — M9903 Segmental and somatic dysfunction of lumbar region: Secondary | ICD-10-CM | POA: Diagnosis not present

## 2022-11-01 DIAGNOSIS — M5432 Sciatica, left side: Secondary | ICD-10-CM | POA: Diagnosis not present

## 2022-12-01 DIAGNOSIS — M9903 Segmental and somatic dysfunction of lumbar region: Secondary | ICD-10-CM | POA: Diagnosis not present

## 2022-12-01 DIAGNOSIS — M5432 Sciatica, left side: Secondary | ICD-10-CM | POA: Diagnosis not present

## 2022-12-07 DIAGNOSIS — D2371 Other benign neoplasm of skin of right lower limb, including hip: Secondary | ICD-10-CM | POA: Diagnosis not present

## 2022-12-07 DIAGNOSIS — L821 Other seborrheic keratosis: Secondary | ICD-10-CM | POA: Diagnosis not present

## 2022-12-07 DIAGNOSIS — L814 Other melanin hyperpigmentation: Secondary | ICD-10-CM | POA: Diagnosis not present

## 2022-12-07 DIAGNOSIS — L820 Inflamed seborrheic keratosis: Secondary | ICD-10-CM | POA: Diagnosis not present

## 2022-12-07 DIAGNOSIS — D485 Neoplasm of uncertain behavior of skin: Secondary | ICD-10-CM | POA: Diagnosis not present

## 2022-12-07 DIAGNOSIS — L910 Hypertrophic scar: Secondary | ICD-10-CM | POA: Diagnosis not present

## 2022-12-15 DIAGNOSIS — M9903 Segmental and somatic dysfunction of lumbar region: Secondary | ICD-10-CM | POA: Diagnosis not present

## 2022-12-15 DIAGNOSIS — M5432 Sciatica, left side: Secondary | ICD-10-CM | POA: Diagnosis not present

## 2023-04-20 ENCOUNTER — Encounter: Payer: Self-pay | Admitting: Nurse Practitioner

## 2023-05-17 ENCOUNTER — Other Ambulatory Visit: Payer: Self-pay

## 2023-05-17 DIAGNOSIS — Z8679 Personal history of other diseases of the circulatory system: Secondary | ICD-10-CM

## 2023-05-17 DIAGNOSIS — Z0189 Encounter for other specified special examinations: Secondary | ICD-10-CM

## 2023-05-31 ENCOUNTER — Other Ambulatory Visit: Payer: BC Managed Care – PPO

## 2023-05-31 DIAGNOSIS — Z0189 Encounter for other specified special examinations: Secondary | ICD-10-CM | POA: Diagnosis not present

## 2023-05-31 DIAGNOSIS — Z13220 Encounter for screening for lipoid disorders: Secondary | ICD-10-CM | POA: Diagnosis not present

## 2023-05-31 LAB — CMP14+EGFR
ALT: 22 [IU]/L (ref 0–32)
AST: 13 [IU]/L (ref 0–40)
Albumin: 4.5 g/dL (ref 3.9–4.9)
Alkaline Phosphatase: 62 [IU]/L (ref 44–121)
BUN/Creatinine Ratio: 13 (ref 9–23)
BUN: 9 mg/dL (ref 6–20)
Bilirubin Total: 0.5 mg/dL (ref 0.0–1.2)
CO2: 23 mmol/L (ref 20–29)
Calcium: 8.9 mg/dL (ref 8.7–10.2)
Chloride: 101 mmol/L (ref 96–106)
Creatinine, Ser: 0.72 mg/dL (ref 0.57–1.00)
Globulin, Total: 2.9 g/dL (ref 1.5–4.5)
Glucose: 99 mg/dL (ref 70–99)
Potassium: 4.4 mmol/L (ref 3.5–5.2)
Sodium: 136 mmol/L (ref 134–144)
Total Protein: 7.4 g/dL (ref 6.0–8.5)
eGFR: 114 mL/min/{1.73_m2} (ref >59)

## 2023-05-31 LAB — LIPID PANEL
Chol/HDL Ratio: 3.4 {ratio} (ref 0.0–4.4)
Cholesterol, Total: 197 mg/dL (ref 100–199)
HDL: 58 mg/dL (ref >39)
LDL Chol Calc (NIH): 120 mg/dL — ABNORMAL HIGH (ref 0–99)
Triglycerides: 104 mg/dL (ref 0–149)
VLDL Cholesterol Cal: 19 mg/dL (ref 5–40)

## 2023-07-04 DIAGNOSIS — M5432 Sciatica, left side: Secondary | ICD-10-CM | POA: Diagnosis not present

## 2023-07-04 DIAGNOSIS — M9903 Segmental and somatic dysfunction of lumbar region: Secondary | ICD-10-CM | POA: Diagnosis not present

## 2023-07-19 DIAGNOSIS — M5432 Sciatica, left side: Secondary | ICD-10-CM | POA: Diagnosis not present

## 2023-07-19 DIAGNOSIS — M9903 Segmental and somatic dysfunction of lumbar region: Secondary | ICD-10-CM | POA: Diagnosis not present

## 2023-08-01 ENCOUNTER — Ambulatory Visit: Payer: BC Managed Care – PPO | Admitting: Nurse Practitioner

## 2023-08-01 VITALS — BP 130/82 | HR 77 | Ht 66.5 in | Wt 236.0 lb

## 2023-08-01 DIAGNOSIS — F4323 Adjustment disorder with mixed anxiety and depressed mood: Secondary | ICD-10-CM | POA: Insufficient documentation

## 2023-08-01 DIAGNOSIS — Z0000 Encounter for general adult medical examination without abnormal findings: Secondary | ICD-10-CM | POA: Diagnosis not present

## 2023-08-01 DIAGNOSIS — E039 Hypothyroidism, unspecified: Secondary | ICD-10-CM

## 2023-08-01 MED ORDER — SERTRALINE HCL 50 MG PO TABS: 50.0000 mg | ORAL_TABLET | Freq: Every day | ORAL | 3 refills | Status: DC

## 2023-08-01 NOTE — Progress Notes (Signed)
 Dell Fennel, DNP, AGNP-c [facility] [address] [place], [place] [address] Main Office [phone]  BP 130/82   Pulse 77   Ht 5' 6.5" (1.689 m)   Wt 236 lb (107 kg)   Breastfeeding No   BMI 37.52 kg/m    Subjective:    Patient ID: Christy Rivera, female    DOB: [birth year], 33 y.o.   MRN: [id number]  HPI: Christy Rivera is a 33 y.o. female presenting on 08/01/2023 for comprehensive medical examination.   History of Present Illness Christy Rivera is a 33 year old female who presents with anxiety and stress management concerns.  She has been experiencing increased anxiety and stress over the past year, particularly since having twins last year and managing a toddler. Her current state is described as 'survival mode', with anxiety becoming more crippling due to added responsibilities. She sometimes struggles to respond to high-intensity situations as she would like and feels that her anxiety may be affecting her children.  She has attempted various coping mechanisms such as socializing with friends and exercising, but finds these insufficient given her current life circumstances. 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  She has no local family support aside from her husband, which adds to her stress. Her daily life is overwhelming, with moments where she feels she is 'about to lose her mind' but still has to manage daily tasks like cooking and childcare. Her husband is supportive and helps with tasks like taking the toddler to school.  In terms of her physical health, she reports decreased energy levels, which she attributes to disrupted sleep due to her children's illnesses and her recent return to exercise. No changes in bowel or bladder habits and she is not currently breastfeeding. She has a history of her children having tongue ties and  gut issues, which influenced her decision to use hypoallergenic formula.  Pertinent items are noted in HPI.    Most Recent Depression Screen:     08/01/2023    2:24 PM 12/15/2020    3:37 PM 08/26/2020    8:55 AM 07/27/2019    8:29 AM 07/06/2018   10:34 AM  Depression screen PHQ 2/9  Decreased Interest 0 0 0 0 0  Down, Depressed, Hopeless 0 0 0 0 0  PHQ - 2 Score 0 0 0 0 0  Altered sleeping  0     Tired, decreased energy  0     Change in appetite  0     Feeling bad or failure about yourself   0     Trouble concentrating  0     Moving slowly or fidgety/restless  0     Suicidal thoughts  0     PHQ-9 Score  0      Most Recent Anxiety Screen:     08/01/2023    2:24 PM 12/15/2020    3:37 PM  GAD 7 : Generalized Anxiety Score  Nervous, Anxious, on Edge 2 1  Control/stop worrying 1 0  Worry too much - different things 1 1  Trouble relaxing 1 0  Restless 1 0  Easily annoyed or irritable 2 0  Afraid - awful might happen 0 0  Total GAD 7 Score 8 2  Anxiety Difficulty Very difficult Not difficult at all   Most Recent Fall Screen:    08/01/2023    2:24 PM 12/15/2020  3:36 PM 08/26/2020    8:54 AM 07/27/2019    8:29 AM 07/06/2018   10:34 AM  Fall Risk   Falls in the past year? 0 0 0 0 0  Number falls in past yr: 0 0 0 0   Injury with Fall? 0 0 0 0   Risk for fall due to : No Fall Risks No Fall Risks     Follow up Falls evaluation completed Falls evaluation completed Falls evaluation completed      Past medical history, surgical history, medications, allergies, family history and social history reviewed with patient today and changes made to appropriate areas of the chart.  Past Medical History:  Past Medical History:  Diagnosis Date   Allergic reaction 03/05/2019   Asthma    Chronic hypertension 06/11/2020   Eczema    Essential hypertension 12/29/2018   Essential hypertension, benign    Hypothyroidism    Pregnancy induced hypertension    Urticaria    Medications:  Current  Outpatient Medications on File Prior to Visit  Medication Sig   albuterol  (VENTOLIN  HFA) 108 (90 Base) MCG/ACT inhaler Inhale 2 puffs into the lungs every 6 (six) hours as needed for wheezing or shortness of breath.   levothyroxine  (SYNTHROID ) 75 MCG tablet TAKE 1 TABLET(75 MCG) BY MOUTH DAILY   Probiotic Product (PROBIOTIC DAILY PO) Take by mouth.   No current facility-administered medications on file prior to visit.   Surgical History:  Past Surgical History:  Procedure Laterality Date   CESAREAN SECTION N/A 08/26/2022   Procedure: PRIMARY CESAREAN SECTION EDC: 09-14-22  ALLERG: NKDA;  Surgeon: Dyanna Glasgow, DO;  Location: [facility] LD ORS;  Service: Obstetrics;  Laterality: N/A;   NO PAST SURGERIES     WISDOM TOOTH EXTRACTION     Allergies:  No Known Allergies Family History:  Family History  Problem Relation Age of Onset   Heart disease Mother    Hyperlipidemia Mother    Hypertension Mother    Allergic rhinitis Mother    Asthma Mother    Healthy Brother    Heart disease Paternal Uncle    Hyperlipidemia Paternal Uncle    Hypertension Paternal Uncle    Cancer Maternal Grandfather    Diabetes Paternal Grandmother        Objective:    BP 130/82   Pulse 77   Ht 5' 6.5" (1.689 m)   Wt 236 lb (107 kg)   Breastfeeding No   BMI 37.52 kg/m   Wt Readings from Last 3 Encounters:  08/01/23 236 lb (107 kg)  09/16/22 229 lb 9.6 oz (104.1 kg)  08/26/22 274 lb 12.8 oz (124.6 kg)    Physical Exam Vitals and nursing note reviewed.  Constitutional:      General: She is not in acute distress.    Appearance: Normal appearance.  HENT:     Head: Normocephalic and atraumatic.     Right Ear: Hearing, tympanic membrane, ear canal and external ear normal.     Left Ear: Hearing, tympanic membrane, ear canal and external ear normal.     Nose: Nose normal.     Right Sinus: No maxillary sinus tenderness or frontal sinus tenderness.     Left Sinus: No maxillary sinus tenderness or frontal  sinus tenderness.     Mouth/Throat:     Lips: Pink.     Mouth: Mucous membranes are moist.     Pharynx: Oropharynx is clear.  Eyes:     General: Lids are normal. Vision grossly  intact.     Extraocular Movements: Extraocular movements intact.     Conjunctiva/sclera: Conjunctivae normal.     Pupils: Pupils are equal, round, and reactive to light.     Funduscopic exam:    Right eye: Red reflex present.        Left eye: Red reflex present.    Visual Fields: Right eye visual fields normal and left eye visual fields normal.  Neck:     Thyroid : No thyromegaly.     Vascular: No carotid bruit.  Cardiovascular:     Rate and Rhythm: Normal rate and regular rhythm.     Chest Wall: PMI is not displaced.     Pulses: Normal pulses.          Dorsalis pedis pulses are 2+ on the right side and 2+ on the left side.       Posterior tibial pulses are 2+ on the right side and 2+ on the left side.     Heart sounds: Normal heart sounds. No murmur heard. Pulmonary:     Effort: Pulmonary effort is normal. No respiratory distress.     Breath sounds: Normal breath sounds.  Abdominal:     General: Abdomen is flat. Bowel sounds are normal. There is no distension.     Palpations: Abdomen is soft. There is no hepatomegaly, splenomegaly or mass.     Tenderness: There is no abdominal tenderness. There is no right CVA tenderness, left CVA tenderness, guarding or rebound.  Musculoskeletal:        General: Normal range of motion.     Cervical back: Full passive range of motion without pain, normal range of motion and neck supple. No tenderness.     Right lower leg: No edema.     Left lower leg: No edema.  Feet:     Left foot:     Toenail Condition: Left toenails are normal.  Lymphadenopathy:     Cervical: No cervical adenopathy.     Upper Body:     Right upper body: No supraclavicular adenopathy.     Left upper body: No supraclavicular adenopathy.  Skin:    General: Skin is warm and dry.     Capillary  Refill: Capillary refill takes less than 2 seconds.     Nails: There is no clubbing.  Neurological:     General: No focal deficit present.     Mental Status: She is alert and oriented to person, place, and time.     GCS: GCS eye subscore is 4. GCS verbal subscore is 5. GCS motor subscore is 6.     Sensory: Sensation is intact.     Motor: Motor function is intact.     Coordination: Coordination is intact.     Gait: Gait is intact.     Deep Tendon Reflexes: Reflexes are normal and symmetric.  Psychiatric:        Attention and Perception: Attention normal.        Mood and Affect: Mood normal. Affect is tearful.        Speech: Speech normal.        Behavior: Behavior normal. Behavior is cooperative.        Thought Content: Thought content normal.        Cognition and Memory: Cognition and memory normal.        Judgment: Judgment normal.      Results for orders placed or performed in visit on 05/31/23  Lipid Panel   Collection Time: 05/31/23  9:37 AM  Result Value Ref Range   Cholesterol, Total 197 100 - 199 mg/dL   Triglycerides 161 0 - 149 mg/dL   HDL 58 >09 mg/dL   VLDL Cholesterol Cal 19 5 - 40 mg/dL   LDL Chol Calc (NIH) 604 (H) 0 - 99 mg/dL   Chol/HDL Ratio 3.4 0.0 - 4.4 ratio  CMP14+EGFR   Collection Time: 05/31/23  9:37 AM  Result Value Ref Range   Glucose 99 70 - 99 mg/dL   BUN 9 6 - 20 mg/dL   Creatinine, Ser 5.40 0.57 - 1.00 mg/dL   eGFR 981 >19 JY/NWG/9.56   BUN/Creatinine Ratio 13 9 - 23   Sodium 136 134 - 144 mmol/L   Potassium 4.4 3.5 - 5.2 mmol/L   Chloride 101 96 - 106 mmol/L   CO2 23 20 - 29 mmol/L   Calcium 8.9 8.7 - 10.2 mg/dL   Total Protein 7.4 6.0 - 8.5 g/dL   Albumin 4.5 3.9 - 4.9 g/dL   Globulin, Total 2.9 1.5 - 4.5 g/dL   Bilirubin Total 0.5 0.0 - 1.2 mg/dL   Alkaline Phosphatase 62 44 - 121 IU/L   AST 13 0 - 40 IU/L   ALT 22 0 - 32 IU/L       Assessment & Plan:   Problem List Items Addressed This Visit     Hypothyroidism   Reports  decreased energy, attributed to disrupted sleep and childcare demands. No clear indication of thyroid  abnormality, but considered as a potential factor. - Order thyroid  function tests.      Relevant Orders   CBC with Differential/Platelet   Comprehensive metabolic panel   TSH   T4, free   Encounter for annual physical exam - Primary   CPE completed today. Review of HM activities and recommendations discussed and provided on AVS. Anticipatory guidance, diet, and exercise recommendations provided. Medications, allergies, and hx reviewed and updated as necessary. Orders placed as listed below.  Plan: - Labs ordered. Will make changes as necessary based on results.  - I will review these results and send recommendations via MyChart or a telephone call.  - F/U with CPE in 1 year or sooner for acute/chronic health needs as directed.        Relevant Orders   CBC with Differential/Platelet   Comprehensive metabolic panel   TSH   T4, free   Situational mixed anxiety and depressive disorder   Increased anxiety since the birth of her twins last year, exacerbated by managing a toddler. Anxiety affects her ability to handle high-intensity situations. Coping mechanisms like socializing and exercising are insufficient. Open to counseling and medication but finds consistent counseling challenging due to her responsibilities. Discussed SSRIs (Lexapro, Zoloft ) and Buspar. SSRIs effective at low doses, work within 1-2 weeks, and are not necessarily long-term. Buspar is non-addictive, minimal side effects, can be used as needed or scheduled. Prefers starting with sertraline  for daily management. - Initiate sertraline  50 mg at bedtime. - Follow up with a phone call in 4-6 weeks to assess medication effectiveness and adjust as needed. - Consider Buspar as an adjunct for acute anxiety episodes.       Relevant Medications   sertraline  (ZOLOFT ) 50 MG tablet      Follow up plan: Return in about 4 weeks  (around 08/29/2023) for Med Management 30- Virtual  (4-6 weeks).  NEXT PREVENTATIVE PHYSICAL DUE IN 1 YEAR.  PATIENT COUNSELING PROVIDED FOR ALL ADULT PATIENTS: A well balanced diet low in saturated  fats, cholesterol, and moderation in carbohydrates.  This can be as simple as monitoring portion sizes and cutting back on sugary beverages such as soda and juice to start with.    Daily water  consumption of at least 64 ounces.  Physical activity at least 180 minutes per week.  If just starting out, start 10 minutes a day and work your way up.   This can be as simple as taking the stairs instead of the elevator and walking 2-3 laps around the office  purposefully every day.   STD protection, partner selection, and regular testing if high risk.  Limited consumption of alcoholic beverages if alcohol is consumed. For men, I recommend no more than 14 alcoholic beverages per week, spread out throughout the week (max 2 per day). Avoid "binge" drinking or consuming large quantities of alcohol in one setting.  Please let me know if you feel you may need help with reduction or quitting alcohol consumption.   Avoidance of nicotine, if used. Please let me know if you feel you may need help with reduction or quitting nicotine use.   Daily mental health attention. This can be in the form of 5 minute daily meditation, prayer, journaling, yoga, reflection, etc.  Purposeful attention to your emotions and mental state can significantly improve your overall wellbeing  and  Health.  Please know that I am here to help you with all of your health care goals and am happy to work with you to find a solution that works best for you.  The greatest advice I have received with any changes in life are to take it one step at a time, that even means if all you can focus on is the next 60 seconds, then do that and celebrate your victories.  With any changes in life, you will have set backs, and that is OK. The important  thing to remember is, if you have a set back, it is not a failure, it is an opportunity to try again! Screening Testing Mammogram Every 1 -2 years based on history and risk factors Starting at age 80 Pap Smear Ages 21-39 every 3 years Ages 20-65 every 5 years with HPV testing More frequent testing may be required based on results and history Colon Cancer Screening Every 1-10 years based on test performed, risk factors, and history Starting at age 63 Bone Density Screening Every 2-10 years based on history Starting at age 67 for women Recommendations for men differ based on medication usage, history, and risk factors AAA Screening One time ultrasound Men 63-59 years old who have every smoked Lung Cancer Screening Low Dose Lung CT every 12 months Age 84-80 years with a 30 pack-year smoking history who still smoke or who have quit within the last 15 years   Screening Labs Routine  Labs: Complete Blood Count (CBC), Complete Metabolic Panel (CMP), Cholesterol (Lipid Panel) Every 6-12 months based on history and medications May be recommended more frequently based on current conditions or previous results Hemoglobin A1c Lab Every 3-12 months based on history and previous results Starting at age 52 or earlier with diagnosis of diabetes, high cholesterol, BMI >26, and/or risk factors Frequent monitoring for patients with diabetes to ensure blood sugar control Thyroid  Panel (TSH) Every 6 months based on history, symptoms, and risk factors May be repeated more often if on medication HIV One time testing for all patients 23 and older May be repeated more frequently for patients with increased risk factors or exposure Hepatitis C  One time testing for all patients 57 and older May be repeated more frequently for patients with increased risk factors or exposure Gonorrhea, Chlamydia Every 12 months for all sexually active persons 13-24 years Additional monitoring may be recommended for  those who are considered high risk or who have symptoms Every 12 months for any woman on birth control, regardless of sexual activity PSA Men 45-65 years old with risk factors Additional screening may be recommended from age 65-69 based on risk factors, symptoms, and history  Vaccine Recommendations Tetanus Booster All adults every 10 years Flu Vaccine All patients 6 months and older every year COVID Vaccine All patients 12 years and older Initial dosing with booster May recommend additional booster based on age and health history HPV Vaccine 2 doses all patients age 1-26 Dosing may be considered for patients over 26 Shingles Vaccine (Shingrix) 2 doses all adults 55 years and older Pneumonia (Pneumovax 3) All adults 65 years and older May recommend earlier dosing based on health history One year apart from Prevnar 12 Pneumonia (Prevnar 74) All adults 65 years and older Dosed 1 year after Pneumovax 23 Pneumonia (Prevnar 20) One time alternative to the two dosing of 13 and 23 For all adults with initial dose of 23, 20 is recommended 1 year later For all adults with initial dose of 13, 23 is still recommended as second option 1 year later

## 2023-08-01 NOTE — Patient Instructions (Addendum)
 I have sent in sertraline  for you start at bedtime. This is just once a day. You should start to feel it working in the next week or so. If you don't feel like it is making any difference in the next two weeks, please let me know.    Managing Anxiety, Adult After being diagnosed with anxiety, you may be relieved to know why you have felt or behaved a certain way. You may also feel overwhelmed about the treatment ahead and what it will mean for your life. With care and support, you can manage your anxiety. How to manage lifestyle changes Understanding the difference between stress and anxiety Although stress can play a role in anxiety, it is not the same as anxiety. Stress is your body's reaction to life changes and events, both good and bad. Stress is often caused by something external, such as a deadline, test, or competition. It normally goes away after the event has ended and will last just a few hours. But, stress can be ongoing and can lead to more than just stress. Anxiety is caused by something internal, such as imagining a terrible outcome or worrying that something will go wrong that will greatly upset you. Anxiety often does not go away even after the event is over, and it can become a long-term (chronic) worry. Lowering stress and anxiety Talk with your health care provider or a counselor to learn more about lowering anxiety and stress. They may suggest tension-reduction techniques, such as: Music. Spend time creating or listening to music that you enjoy and that inspires you. Mindfulness-based meditation. Practice being aware of your normal breaths while not trying to control your breathing. It can be done while sitting or walking. Centering prayer. Focus on a word, phrase, or sacred image that means something to you and brings you peace. Deep breathing. Expand your stomach and inhale slowly through your nose. Hold your breath for 3-5 seconds. Then breathe out slowly, letting your stomach  muscles relax. Self-talk. Learn to notice and spot thought patterns that lead to anxiety reactions. Change those patterns to thoughts that feel peaceful. Muscle relaxation. Take time to tense muscles and then relax them. Choose a tension-reduction technique that fits your lifestyle and personality. These techniques take time and practice. Set aside 5-15 minutes a day to do them. Specialized therapists can offer counseling and training in these techniques. The training to help with anxiety may be covered by some insurance plans. Other things you can do to manage stress and anxiety include: Keeping a stress diary. This can help you learn what triggers your reaction and then learn ways to manage your response. Thinking about how you react to certain situations. You may not be able to control everything, but you can control your response. Making time for activities that help you relax and not feeling guilty about spending your time in this way. Doing visual imagery. This involves imagining or creating mental pictures to help you relax. Practicing yoga. Through yoga poses, you can lower tension and relax.  Medicines Medicines for anxiety include: Antidepressant medicines. These are usually prescribed for long-term daily control. Anti-anxiety medicines. These may be added in severe cases, especially when panic attacks occur. When used together, medicines, psychotherapy, and tension-reduction techniques may be the most effective treatment. Relationships Relationships can play a big part in helping you recover. Spend more time connecting with trusted friends and family members. Think about going to couples counseling if you have a partner, taking family education classes, or  going to family therapy. Therapy can help you and others better understand your anxiety. How to recognize changes in your anxiety Everyone responds differently to treatment for anxiety. Recovery from anxiety happens when symptoms  lessen and stop interfering with your daily life at home or work. This may mean that you will start to: Have better concentration and focus. Worry will interfere less in your daily thinking. Sleep better. Be less irritable. Have more energy. Have improved memory. Try to recognize when your condition is getting worse. Contact your provider if your symptoms interfere with home or work and you feel like your condition is not improving. Follow these instructions at home: Activity Exercise. Adults should: Exercise for at least 150 minutes each week. The exercise should increase your heart rate and make you sweat (moderate-intensity exercise). Do strengthening exercises at least twice a week. Get the right amount and quality of sleep. Most adults need 7-9 hours of sleep each night. Lifestyle  Eat a healthy diet that includes plenty of vegetables, fruits, whole grains, low-fat dairy products, and lean protein. Do not eat a lot of foods that are high in fats, added sugars, or salt (sodium). Make choices that simplify your life. Do not use any products that contain nicotine or tobacco. These products include cigarettes, chewing tobacco, and vaping devices, such as e-cigarettes. If you need help quitting, ask your provider. Avoid caffeine, alcohol, and certain over-the-counter cold medicines. These may make you feel worse. Ask your pharmacist which medicines to avoid. General instructions Take over-the-counter and prescription medicines only as told by your provider. Keep all follow-up visits. This is to make sure you are managing your anxiety well or if you need more support. Where to find support You can get help and support from: Self-help groups. Online and [employer]. A trusted spiritual leader. Couples counseling. Family education classes. Family therapy. Where to find more information You may find that joining a support group helps you deal with your anxiety. The following  sources can help you find counselors or support groups near you: Mental Health America: mentalhealthamerica.net Anxiety and Depression Association of [place] (ADAA): adaa.org The [employer] on Mental Illness (NAMI): nami.org Contact a health care provider if: You have a hard time staying focused or finishing tasks. You spend many hours a day feeling worried about everyday life. You are very tired because you cannot stop worrying. You start to have headaches or often feel tense. You have chronic nausea or diarrhea. Get help right away if: Your heart feels like it is racing. You have shortness of breath. You have thoughts of hurting yourself or others. Get help right away if you feel like you may hurt yourself or others, or have thoughts about taking your own life. Go to your nearest emergency room or: Call 911. Call the National Suicide Prevention Lifeline at 737-494-5199 or 988. This is open 24 hours a day. Text the Crisis Text Line at (442)282-8702. This information is not intended to replace advice given to you by your health care provider. Make sure you discuss any questions you have with your health care provider. Document Revised: 03/16/2022 Document Reviewed: 09/28/2020 Elsevier Patient Education  2024 [employer].

## 2023-08-01 NOTE — Assessment & Plan Note (Signed)

## 2023-08-01 NOTE — Assessment & Plan Note (Signed)
 Increased anxiety since the birth of her twins last year, exacerbated by managing a toddler. Anxiety affects her ability to handle high-intensity situations. Coping mechanisms like socializing and exercising are insufficient. Open to counseling and medication but finds consistent counseling challenging due to her responsibilities. Discussed SSRIs (Lexapro, Zoloft ) and Buspar. SSRIs effective at low doses, work within 1-2 weeks, and are not necessarily long-term. Buspar is non-addictive, minimal side effects, can be used as needed or scheduled. Prefers starting with sertraline  for daily management. - Initiate sertraline  50 mg at bedtime. - Follow up with a phone call in 4-6 weeks to assess medication effectiveness and adjust as needed. - Consider Buspar as an adjunct for acute anxiety episodes.

## 2023-08-01 NOTE — Assessment & Plan Note (Signed)
 Reports decreased energy, attributed to disrupted sleep and childcare demands. No clear indication of thyroid  abnormality, but considered as a potential factor. - Order thyroid  function tests.

## 2023-08-02 DIAGNOSIS — M5432 Sciatica, left side: Secondary | ICD-10-CM | POA: Diagnosis not present

## 2023-08-02 DIAGNOSIS — M9903 Segmental and somatic dysfunction of lumbar region: Secondary | ICD-10-CM | POA: Diagnosis not present

## 2023-08-02 LAB — CBC WITH DIFFERENTIAL/PLATELET
Basophils Absolute: 0.1 10*3/uL (ref 0.0–0.2)
Basos: 1 %
EOS (ABSOLUTE): 0.5 10*3/uL — ABNORMAL HIGH (ref 0.0–0.4)
Eos: 6 %
Hematocrit: 43.4 % (ref 34.0–46.6)
Hemoglobin: 14.2 g/dL (ref 11.1–15.9)
Immature Grans (Abs): 0 10*3/uL (ref 0.0–0.1)
Immature Granulocytes: 0 %
Lymphocytes Absolute: 2.9 10*3/uL (ref 0.7–3.1)
Lymphs: 34 %
MCH: 28.9 pg (ref 26.6–33.0)
MCHC: 32.7 g/dL (ref 31.5–35.7)
MCV: 88 fL (ref 79–97)
Monocytes Absolute: 0.6 10*3/uL (ref 0.1–0.9)
Monocytes: 7 %
Neutrophils Absolute: 4.6 10*3/uL (ref 1.4–7.0)
Neutrophils: 52 %
Platelets: 317 10*3/uL (ref 150–450)
RBC: 4.91 x10E6/uL (ref 3.77–5.28)
RDW: 13.5 % (ref 11.7–15.4)
WBC: 8.7 10*3/uL (ref 3.4–10.8)

## 2023-08-02 LAB — COMPREHENSIVE METABOLIC PANEL
ALT: 14 IU/L (ref 0–32)
AST: 12 IU/L (ref 0–40)
Albumin: 4.7 g/dL (ref 3.9–4.9)
Alkaline Phosphatase: 55 IU/L (ref 44–121)
BUN/Creatinine Ratio: 18 (ref 9–23)
BUN: 16 mg/dL (ref 6–20)
Bilirubin Total: 0.3 mg/dL (ref 0.0–1.2)
CO2: 21 mmol/L (ref 20–29)
Calcium: 9.4 mg/dL (ref 8.7–10.2)
Chloride: 103 mmol/L (ref 96–106)
Creatinine, Ser: 0.88 mg/dL (ref 0.57–1.00)
Globulin, Total: 2.5 g/dL (ref 1.5–4.5)
Glucose: 97 mg/dL (ref 70–99)
Potassium: 4 mmol/L (ref 3.5–5.2)
Sodium: 138 mmol/L (ref 134–144)
Total Protein: 7.2 g/dL (ref 6.0–8.5)
eGFR: 89 mL/min/{1.73_m2} (ref >59)

## 2023-08-02 LAB — TSH: TSH: 1.83 u[IU]/mL (ref 0.450–4.500)

## 2023-08-02 LAB — T4, FREE: Free T4: 1.19 ng/dL (ref 0.82–1.77)

## 2023-08-11 ENCOUNTER — Encounter: Payer: Self-pay | Admitting: Nurse Practitioner

## 2023-08-16 DIAGNOSIS — M9903 Segmental and somatic dysfunction of lumbar region: Secondary | ICD-10-CM | POA: Diagnosis not present

## 2023-08-16 DIAGNOSIS — M5432 Sciatica, left side: Secondary | ICD-10-CM | POA: Diagnosis not present

## 2023-08-30 DIAGNOSIS — M9903 Segmental and somatic dysfunction of lumbar region: Secondary | ICD-10-CM | POA: Diagnosis not present

## 2023-08-30 DIAGNOSIS — M5432 Sciatica, left side: Secondary | ICD-10-CM | POA: Diagnosis not present

## 2023-09-01 ENCOUNTER — Encounter: Payer: Self-pay | Admitting: Nurse Practitioner

## 2023-09-01 ENCOUNTER — Telehealth (INDEPENDENT_AMBULATORY_CARE_PROVIDER_SITE_OTHER): Payer: BC Managed Care – PPO | Admitting: Nurse Practitioner

## 2023-09-01 DIAGNOSIS — F4323 Adjustment disorder with mixed anxiety and depressed mood: Secondary | ICD-10-CM | POA: Diagnosis not present

## 2023-09-01 DIAGNOSIS — E039 Hypothyroidism, unspecified: Secondary | ICD-10-CM

## 2023-09-01 MED ORDER — SERTRALINE HCL 50 MG PO TABS: 50.0000 mg | ORAL_TABLET | Freq: Every day | ORAL | 3 refills | Status: AC

## 2023-09-01 MED ORDER — LEVOTHYROXINE SODIUM 75 MCG PO TABS: ORAL_TABLET | ORAL | 3 refills | Status: AC

## 2023-09-01 NOTE — Assessment & Plan Note (Signed)
 Most recent labs stable. No changes to medication. Refills for 1 year sent.

## 2023-09-01 NOTE — Assessment & Plan Note (Signed)
 Significant improvement in anxiety symptoms since starting sertraline (Zoloft). Initially experienced afternoon fatigue and night sweats, which have resolved. Continues to experience nausea if she delays eating after taking the medication. The current dose is effective, and she does not wish to change it at this time. Discussed potential signs of overmedication, such as emotional numbness, and provided guidance on adjusting the dose if needed. If symptoms of anxiety return, the next step up would be 75 mg, which can be achieved by taking one and a half tablets. If she chooses to discontinue the medication, she can either stop the current dose or taper by halving the dose for a week. If any dose changes are needed, she understands she can make the change and contact me OR contact me first to discuss, whatever she is comfortable with. As long as she remains stable, we will plan to follow-up with her annual exam in a year.  - Continue sertraline at current dose - Provide a 90-day prescription refill - Monitor for signs of overmedication, such as emotional numbness - Adjust dose if symptoms of anxiety return or if overmedication signs appear

## 2023-09-01 NOTE — Patient Instructions (Signed)
 I am so happy the medication is working well for you and you are feeling back to yourself!!  As we discussed, if you feel like the medication is a little too much, you can cut the tablet in 1/2 and see how you feel on 25mg .  Alternatively, if you feel like you need a little more effect of the medication, you can take 1 1/2 tablets (for a total of 75mg ). If you make the change, please just reach out and let me know so I can make sure your prescription is correct.  If at anytime you need to speak to me or have questions do not hesitate to contact us.

## 2023-09-01 NOTE — Progress Notes (Signed)
 Virtual Visit Encounter mychart visit.   I connected with  Christy Rivera on 09/01/23 at  9:45 AM EDT by secure video and audio telemedicine application. I verified that I am speaking with the correct person using two identifiers.   I introduced myself as a Publishing rights manager with the practice. The limitations of evaluation and management by telemedicine discussed with the patient and the availability of in person appointments. The patient expressed verbal understanding and consent to proceed.  Participating parties in this visit include: Myself and patient  The patient is: Patient Location: Home I am: Provider Location: Office/Clinic Subjective:    CC and HPI: Christy Rivera is a 33 y.o. year old female presenting for follow up of anxiety.  History of Present Illness Christy Rivera presents for a follow-up regarding her response to Zoloft for anxiety and fatigue.  She has experienced significant improvement in anxiety and fatigue symptoms since starting Zoloft. Positive effects were noted within a week, with a reduction in anxiety and feeling overwhelmed. Initially, she experienced afternoon fatigue, which resolved after about a week.  She experiences nausea if she delays eating after taking her medication, particularly when engaging in Christy Rivera morning workouts without eating until later. No vomiting has occurred. Initially, she experienced night sweats, similar to postpartum symptoms, but these have not persisted. She reports sleeping well and denies any other side effects from Zoloft aside from initial fatigue and nausea when not eating promptly after taking the medication.  She is currently satisfied with her medication dose and does not feel the need to adjust it. She is taking Synthroid, her last labs a month ago were stable and she is in need of an annual refill.  Past medical history, Surgical history, Family history not pertinant except as noted below, Social history, Allergies, and  medications have been entered into the medical record, reviewed, and corrections made.   Review of Systems:  All review of systems negative except what is listed in the HPI  Objective:    Alert and oriented x 4 Speaking in clear sentences with no shortness of breath. Smiling with normal/happy mood No distress.  Impression and Recommendations:    Problem List Items Addressed This Visit     Hypothyroidism   Most recent labs stable. No changes to medication. Refills for 1 year sent.       Relevant Medications   levothyroxine (SYNTHROID) 75 MCG tablet   Situational mixed anxiety and depressive disorder   Significant improvement in anxiety symptoms since starting sertraline (Zoloft). Initially experienced afternoon fatigue and night sweats, which have resolved. Continues to experience nausea if she delays eating after taking the medication. The current dose is effective, and she does not wish to change it at this time. Discussed potential signs of overmedication, such as emotional numbness, and provided guidance on adjusting the dose if needed. If symptoms of anxiety return, the next step up would be 75 mg, which can be achieved by taking one and a half tablets. If she chooses to discontinue the medication, she can either stop the current dose or taper by halving the dose for a week. If any dose changes are needed, she understands she can make the change and contact me OR contact me first to discuss, whatever she is comfortable with. As long as she remains stable, we will plan to follow-up with her annual exam in a year.  - Continue sertraline at current dose - Provide a 90-day prescription refill - Monitor for signs of overmedication, such  as emotional numbness - Adjust dose if symptoms of anxiety return or if overmedication signs appear      Relevant Medications   sertraline (ZOLOFT) 50 MG tablet    current treatment plan is effective, no change in therapy I discussed the assessment  and treatment plan with the patient. The patient was provided an opportunity to ask questions and all were answered. The patient agreed with the plan and demonstrated an understanding of the instructions.   The patient was advised to call back or seek an in-person evaluation if the symptoms worsen or if the condition fails to improve as anticipated.  Follow-Up: with routine annual exam unless changes present prior to that time.   I provided 13 minutes of non-face-to-face interaction with this non face-to-face encounter including intake, same-day documentation, and chart review.   Tollie Eth, NP , DNP, AGNP-c [facility]

## 2023-09-13 DIAGNOSIS — M9903 Segmental and somatic dysfunction of lumbar region: Secondary | ICD-10-CM | POA: Diagnosis not present

## 2023-09-13 DIAGNOSIS — M5432 Sciatica, left side: Secondary | ICD-10-CM | POA: Diagnosis not present

## 2023-10-06 DIAGNOSIS — M5432 Sciatica, left side: Secondary | ICD-10-CM | POA: Diagnosis not present

## 2023-10-06 DIAGNOSIS — M9903 Segmental and somatic dysfunction of lumbar region: Secondary | ICD-10-CM | POA: Diagnosis not present

## 2023-10-26 DIAGNOSIS — M5432 Sciatica, left side: Secondary | ICD-10-CM | POA: Diagnosis not present

## 2023-10-26 DIAGNOSIS — M9903 Segmental and somatic dysfunction of lumbar region: Secondary | ICD-10-CM | POA: Diagnosis not present

## 2023-11-09 DIAGNOSIS — M5432 Sciatica, left side: Secondary | ICD-10-CM | POA: Diagnosis not present

## 2023-11-09 DIAGNOSIS — M9903 Segmental and somatic dysfunction of lumbar region: Secondary | ICD-10-CM | POA: Diagnosis not present

## 2023-11-23 DIAGNOSIS — M5432 Sciatica, left side: Secondary | ICD-10-CM | POA: Diagnosis not present

## 2023-11-23 DIAGNOSIS — F4323 Adjustment disorder with mixed anxiety and depressed mood: Secondary | ICD-10-CM | POA: Diagnosis not present

## 2023-11-23 DIAGNOSIS — M9903 Segmental and somatic dysfunction of lumbar region: Secondary | ICD-10-CM | POA: Diagnosis not present

## 2023-12-07 DIAGNOSIS — F4323 Adjustment disorder with mixed anxiety and depressed mood: Secondary | ICD-10-CM | POA: Diagnosis not present

## 2023-12-13 DIAGNOSIS — Z6836 Body mass index (BMI) 36.0-36.9, adult: Secondary | ICD-10-CM | POA: Diagnosis not present

## 2023-12-13 DIAGNOSIS — Z124 Encounter for screening for malignant neoplasm of cervix: Secondary | ICD-10-CM | POA: Diagnosis not present

## 2023-12-13 DIAGNOSIS — Z01419 Encounter for gynecological examination (general) (routine) without abnormal findings: Secondary | ICD-10-CM | POA: Diagnosis not present

## 2023-12-13 DIAGNOSIS — Z1151 Encounter for screening for human papillomavirus (HPV): Secondary | ICD-10-CM | POA: Diagnosis not present

## 2023-12-14 DIAGNOSIS — M5432 Sciatica, left side: Secondary | ICD-10-CM | POA: Diagnosis not present

## 2023-12-14 DIAGNOSIS — M9903 Segmental and somatic dysfunction of lumbar region: Secondary | ICD-10-CM | POA: Diagnosis not present

## 2024-01-02 DIAGNOSIS — M5432 Sciatica, left side: Secondary | ICD-10-CM | POA: Diagnosis not present

## 2024-01-02 DIAGNOSIS — M9903 Segmental and somatic dysfunction of lumbar region: Secondary | ICD-10-CM | POA: Diagnosis not present

## 2024-01-04 DIAGNOSIS — F4323 Adjustment disorder with mixed anxiety and depressed mood: Secondary | ICD-10-CM | POA: Diagnosis not present

## 2024-01-16 DIAGNOSIS — M9903 Segmental and somatic dysfunction of lumbar region: Secondary | ICD-10-CM | POA: Diagnosis not present

## 2024-01-16 DIAGNOSIS — M5432 Sciatica, left side: Secondary | ICD-10-CM | POA: Diagnosis not present

## 2024-02-01 DIAGNOSIS — F4323 Adjustment disorder with mixed anxiety and depressed mood: Secondary | ICD-10-CM | POA: Diagnosis not present

## 2024-02-15 DIAGNOSIS — F4323 Adjustment disorder with mixed anxiety and depressed mood: Secondary | ICD-10-CM | POA: Diagnosis not present

## 2024-02-17 DIAGNOSIS — L814 Other melanin hyperpigmentation: Secondary | ICD-10-CM | POA: Diagnosis not present

## 2024-02-17 DIAGNOSIS — D2372 Other benign neoplasm of skin of left lower limb, including hip: Secondary | ICD-10-CM | POA: Diagnosis not present

## 2024-02-17 DIAGNOSIS — L821 Other seborrheic keratosis: Secondary | ICD-10-CM | POA: Diagnosis not present

## 2024-02-17 DIAGNOSIS — D2371 Other benign neoplasm of skin of right lower limb, including hip: Secondary | ICD-10-CM | POA: Diagnosis not present

## 2024-02-29 DIAGNOSIS — F4323 Adjustment disorder with mixed anxiety and depressed mood: Secondary | ICD-10-CM | POA: Diagnosis not present

## 2024-05-09 ENCOUNTER — Encounter: Payer: Self-pay | Admitting: Nurse Practitioner

## 2024-05-09 ENCOUNTER — Other Ambulatory Visit: Payer: Self-pay

## 2024-05-09 DIAGNOSIS — Z0189 Encounter for other specified special examinations: Secondary | ICD-10-CM

## 2024-05-09 DIAGNOSIS — Z8679 Personal history of other diseases of the circulatory system: Secondary | ICD-10-CM

## 2024-05-09 NOTE — Telephone Encounter (Signed)
 Lab scheduled

## 2024-05-11 ENCOUNTER — Other Ambulatory Visit: Payer: Self-pay

## 2024-05-11 DIAGNOSIS — Z8679 Personal history of other diseases of the circulatory system: Secondary | ICD-10-CM

## 2024-05-11 DIAGNOSIS — Z0189 Encounter for other specified special examinations: Secondary | ICD-10-CM

## 2024-05-11 LAB — LIPID PANEL
Chol/HDL Ratio: 3.3 ratio (ref 0.0–4.4)
Cholesterol, Total: 170 mg/dL (ref 100–199)
HDL: 51 mg/dL (ref >39)
LDL Chol Calc (NIH): 107 mg/dL — ABNORMAL HIGH (ref 0–99)
Triglycerides: 61 mg/dL (ref 0–149)
VLDL Cholesterol Cal: 12 mg/dL (ref 5–40)

## 2024-05-15 ENCOUNTER — Ambulatory Visit: Payer: Self-pay | Admitting: Nurse Practitioner

## 2024-05-25 DIAGNOSIS — F4323 Adjustment disorder with mixed anxiety and depressed mood: Secondary | ICD-10-CM | POA: Diagnosis not present

## 2024-06-07 DIAGNOSIS — F4323 Adjustment disorder with mixed anxiety and depressed mood: Secondary | ICD-10-CM | POA: Diagnosis not present

## 2024-07-18 ENCOUNTER — Telehealth: Payer: Self-pay | Admitting: Physician Assistant

## 2024-07-18 DIAGNOSIS — J019 Acute sinusitis, unspecified: Secondary | ICD-10-CM | POA: Diagnosis not present

## 2024-07-18 DIAGNOSIS — B9689 Other specified bacterial agents as the cause of diseases classified elsewhere: Secondary | ICD-10-CM | POA: Diagnosis not present

## 2024-07-18 MED ORDER — AMOXICILLIN-POT CLAVULANATE 875-125 MG PO TABS: 1.0000 | ORAL_TABLET | Freq: Two times a day (BID) | ORAL | 0 refills | Status: AC

## 2024-07-18 NOTE — Progress Notes (Signed)
 " Virtual Visit Consent   Christy Rivera, you are scheduled for a virtual visit with a [facility] provider today. Just as with appointments in the office, your consent must be obtained to participate. Your consent will be active for this visit and any virtual visit you may have with one of our providers in the next 365 days. If you have a MyChart account, a copy of this consent can be sent to you electronically.  As this is a virtual visit, video technology does not allow for your provider to perform a traditional examination. This may limit your provider's ability to fully assess your condition. If your provider identifies any concerns that need to be evaluated in person or the need to arrange testing (such as labs, EKG, etc.), we will make arrangements to do so. Although advances in technology are sophisticated, we cannot ensure that it will always work on either your end or our end. If the connection with a video visit is poor, the visit may have to be switched to a telephone visit. With either a video or telephone visit, we are not always able to ensure that we have a secure connection.  By engaging in this virtual visit, you consent to the provision of healthcare and authorize for your insurance to be billed (if applicable) for the services provided during this visit. Depending on your insurance coverage, you may receive a charge related to this service.  I need to obtain your verbal consent now. Are you willing to proceed with your visit today? Shalini Mair has provided verbal consent on 07/18/2024 for a virtual visit (video or telephone). Delon CHRISTELLA Dickinson, PA-C  Date: 07/18/2024 1:47 PM   Virtual Visit via Video Note   I, Delon CHRISTELLA Dickinson, connected with  Christy Rivera  ([id number], Apr 05, 1991) on 07/18/24 at  1:45 PM EST by a video-enabled telemedicine application and verified that I am speaking with the correct person using two identifiers.  Location: Patient: Virtual  Visit Location Patient: Home Provider: Virtual Visit Location Provider: Home Office   I discussed the limitations of evaluation and management by telemedicine and the availability of in person appointments. The patient expressed understanding and agreed to proceed.    History of Present Illness: Christy Rivera is a 34 y.o. who identifies as a female who was assigned female at birth, and is being seen today for sinus pain.  HPI: Sinusitis This is a new problem. Episode onset: Started feeling bad with URI, 2 Sundays ago (07/08/24), then improved and worsened again last Weds (07/11/24) and progressively worsening since. The problem has been gradually worsening since onset. There has been no fever. The pain is moderate. Associated symptoms include congestion, coughing (mild, productive when it happens), headaches and sinus pressure. Pertinent negatives include no chills, diaphoresis, ear pain, hoarse voice, neck pain, shortness of breath or sore throat. (Body aches, ear fullness) Past treatments include saline nose sprays and oral decongestants (humidifier, sudafed). The treatment provided no relief.    Problems:  Patient Active Problem List   Diagnosis Date Noted   Situational mixed anxiety and depressive disorder 08/01/2023   History of hypertension 09/16/2022   S/P cesarean section 08/26/2022   Encounter for annual physical exam 12/15/2020   Chronic rhinitis 03/05/2019   Hypothyroidism 12/29/2018   Mild intermittent asthma/exercise-induced bronchospasm 12/29/2018    Allergies: Allergies[1] Medications: Current Medications[2]  Observations/Objective: Patient is well-developed, well-nourished in no acute distress.  Resting comfortably at home.  Head is normocephalic, atraumatic.  No labored breathing.  Speech is clear and coherent with logical content.  Patient is alert and oriented at baseline.    Assessment and Plan: 1. Acute bacterial sinusitis (Primary) -  amoxicillin -clavulanate (AUGMENTIN ) 875-125 MG tablet; Take 1 tablet by mouth 2 (two) times daily.  Dispense: 14 tablet; Refill: 0  - Worsening symptoms that have not responded to OTC medications.  - Will give Augmentin  - Continue allergy  medications.  - Steam and humidifier can help - Stay well hydrated and get plenty of rest.  - Seek in person evaluation if no symptom improvement or if symptoms worsen    Follow Up Instructions: I discussed the assessment and treatment plan with the patient. The patient was provided an opportunity to ask questions and all were answered. The patient agreed with the plan and demonstrated an understanding of the instructions.  A copy of instructions were sent to the patient via MyChart unless otherwise noted below.    The patient was advised to call back or seek an in-person evaluation if the symptoms worsen or if the condition fails to improve as anticipated.    Delon HERO Ishmeal Rorie, PA-C     [1] No Known Allergies [2]  Current Outpatient Medications:    amoxicillin -clavulanate (AUGMENTIN ) 875-125 MG tablet, Take 1 tablet by mouth 2 (two) times daily., Disp: 14 tablet, Rfl: 0   albuterol  (VENTOLIN  HFA) 108 (90 Base) MCG/ACT inhaler, Inhale 2 puffs into the lungs every 6 (six) hours as needed for wheezing or shortness of breath., Disp: 8 g, Rfl: 6   levothyroxine  (SYNTHROID ) 75 MCG tablet, TAKE 1 TABLET(75 MCG) BY MOUTH DAILY, Disp: 90 tablet, Rfl: 3   Probiotic Product (PROBIOTIC DAILY PO), Take by mouth., Disp: , Rfl:    sertraline  (ZOLOFT ) 50 MG tablet, Take 1 tablet (50 mg total) by mouth daily., Disp: 90 tablet, Rfl: 3  "

## 2024-07-18 NOTE — Patient Instructions (Signed)
 " Christy Rivera, thank you for joining Christy Rivera CHRISTELLA Dickinson, PA-C for today's virtual visit.  While this provider is not your primary care provider (PCP), if your PCP is located in our provider database this encounter information will be shared with them immediately following your visit.   A [facility] MyChart account gives you access to today's visit and all your visits, tests, and labs performed at [facility]  click here if you don't have a [facility] MyChart account or go to [email or web address]  Consent: (Patient) Christy Rivera provided verbal consent for this virtual visit at the beginning of the encounter.  Current Medications:  Current Outpatient Medications:    amoxicillin -clavulanate (AUGMENTIN ) 875-125 MG tablet, Take 1 tablet by mouth 2 (two) times daily., Disp: 14 tablet, Rfl: 0   albuterol  (VENTOLIN  HFA) 108 (90 Base) MCG/ACT inhaler, Inhale 2 puffs into the lungs every 6 (six) hours as needed for wheezing or shortness of breath., Disp: 8 g, Rfl: 6   levothyroxine  (SYNTHROID ) 75 MCG tablet, TAKE 1 TABLET(75 MCG) BY MOUTH DAILY, Disp: 90 tablet, Rfl: 3   Probiotic Product (PROBIOTIC DAILY PO), Take by mouth., Disp: , Rfl:    sertraline  (ZOLOFT ) 50 MG tablet, Take 1 tablet (50 mg total) by mouth daily., Disp: 90 tablet, Rfl: 3   Medications ordered in this encounter:  Meds ordered this encounter  Medications   amoxicillin -clavulanate (AUGMENTIN ) 875-125 MG tablet    Sig: Take 1 tablet by mouth 2 (two) times daily.    Dispense:  14 tablet    Refill:  0    Supervising Provider:   BLAISE ALEENE KIDD [[id number]]     *If you need refills on other medications prior to your next appointment, please contact your pharmacy*  Follow-Up: Call back or seek an in-person evaluation if the symptoms worsen or if the condition fails to improve as anticipated.  [facility] [phone]  Other Instructions Sinus Infection, Adult A sinus infection,  also called sinusitis, is inflammation of your sinuses. Sinuses are hollow spaces in the bones around your face. Your sinuses are located: Around your eyes. In the middle of your forehead. Behind your nose. In your cheekbones. Mucus normally drains out of your sinuses. When your nasal tissues become inflamed or swollen, mucus can become trapped or blocked. This allows bacteria, viruses, and fungi to grow, which leads to infection. Most infections of the sinuses are caused by a virus. A sinus infection can develop quickly. It can last for up to 4 weeks (acute) or for more than 12 weeks (chronic). A sinus infection often develops after a cold. What are the causes? This condition is caused by anything that creates swelling in the sinuses or stops mucus from draining. This includes: Allergies. Asthma. Infection from bacteria or viruses. Deformities or blockages in your nose or sinuses. Abnormal growths in the nose (nasal polyps). Pollutants, such as chemicals or irritants in the air. Infection from fungi. This is rare. What increases the risk? You are more likely to develop this condition if you: Have a weak body defense system (immune system). Do a lot of swimming or diving. Overuse nasal sprays. Smoke. What are the signs or symptoms? The main symptoms of this condition are pain and a feeling of pressure around the affected sinuses. Other symptoms include: Stuffy nose or congestion that makes it difficult to breathe through your nose. Thick yellow or greenish drainage from your nose. Tenderness, swelling, and warmth over the affected sinuses. A cough  that may get worse at night. Decreased sense of smell and taste. Extra mucus that collects in the throat or the back of the nose (postnasal drip) causing a sore throat or bad breath. Tiredness (fatigue). Fever. How is this diagnosed? This condition is diagnosed based on: Your symptoms. Your medical history. A physical exam. Tests to  find out if your condition is acute or chronic. This may include: Checking your nose for nasal polyps. Viewing your sinuses using a device that has a light (endoscope). Testing for allergies or bacteria. Imaging tests, such as an MRI or CT scan. In rare cases, a bone biopsy may be done to rule out more serious types of fungal sinus disease. How is this treated? Treatment for a sinus infection depends on the cause and whether your condition is chronic or acute. If caused by a virus, your symptoms should go away on their own within 10 days. You may be given medicines to relieve symptoms. They include: Medicines that shrink swollen nasal passages (decongestants). A spray that eases inflammation of the nostrils (topical intranasal corticosteroids). Rinses that help get rid of thick mucus in your nose (nasal saline washes). Medicines that treat allergies (antihistamines). Over-the-counter pain relievers. If caused by bacteria, your health care provider may recommend waiting to see if your symptoms improve. Most bacterial infections will get better without antibiotic medicine. You may be given antibiotics if you have: A severe infection. A weak immune system. If caused by narrow nasal passages or nasal polyps, surgery may be needed. Follow these instructions at home: Medicines Take, use, or apply over-the-counter and prescription medicines only as told by your health care provider. These may include nasal sprays. If you were prescribed an antibiotic medicine, take it as told by your health care provider. Do not stop taking the antibiotic even if you start to feel better. Hydrate and humidify  Drink enough fluid to keep your urine pale yellow. Staying hydrated will help to thin your mucus. Use a cool mist humidifier to keep the humidity level in your home above 50%. Inhale steam for 10-15 minutes, 3-4 times a day, or as told by your health care provider. You can do this in the bathroom while a hot  shower is running. Limit your exposure to cool or dry air. Rest Rest as much as possible. Sleep with your head raised (elevated). Make sure you get enough sleep each night. General instructions  Apply a warm, moist washcloth to your face 3-4 times a day or as told by your health care provider. This will help with discomfort. Use nasal saline washes as often as told by your health care provider. Wash your hands often with soap and water  to reduce your exposure to germs. If soap and water  are not available, use hand sanitizer. Do not smoke. Avoid being around people who are smoking (secondhand smoke). Keep all follow-up visits. This is important. Contact a health care provider if: You have a fever. Your symptoms get worse. Your symptoms do not improve within 10 days. Get help right away if: You have a severe headache. You have persistent vomiting. You have severe pain or swelling around your face or eyes. You have vision problems. You develop confusion. Your neck is stiff. You have trouble breathing. These symptoms may be an emergency. Get help right away. Call 911. Do not wait to see if the symptoms will go away. Do not drive yourself to the hospital. Summary A sinus infection is soreness and inflammation of your sinuses. Sinuses  are hollow spaces in the bones around your face. This condition is caused by nasal tissues that become inflamed or swollen. The swelling traps or blocks the flow of mucus. This allows bacteria, viruses, and fungi to grow, which leads to infection. If you were prescribed an antibiotic medicine, take it as told by your health care provider. Do not stop taking the antibiotic even if you start to feel better. Keep all follow-up visits. This is important. This information is not intended to replace advice given to you by your health care provider. Make sure you discuss any questions you have with your health care provider. Document Revised: 05/12/2021 Document  Reviewed: 05/12/2021 Elsevier Patient Education  2024 [employer]   If you have been instructed to have an in-person evaluation today at a local Urgent Care facility, please use the link below. It will take you to a list of all of our available [facility], including address, phone number and hours of operation. Please do not delay care.  [facility]  If you or a family member do not have a primary care provider, use the link below to schedule a visit and establish care. When you choose a [facility] primary care physician or advanced practice provider, you gain a long-term partner in health. Find a Primary Care Provider  Learn more about [facility] in-office and virtual care options: [facility] - Get Care Now "

## 2024-07-19 ENCOUNTER — Encounter: Payer: Self-pay | Admitting: Internal Medicine

## 2024-08-16 ENCOUNTER — Encounter: Payer: Self-pay | Admitting: Nurse Practitioner

## 2025-02-19 ENCOUNTER — Encounter: Payer: Self-pay | Admitting: Nurse Practitioner
